# Patient Record
Sex: Male | Born: 1955 | Race: Black or African American | Hispanic: No | Smoking: Current every day smoker
Health system: Southern US, Community
[De-identification: ages and names within clinical notes are randomized; demographics above are authoritative.]

## PROBLEM LIST (undated history)

## (undated) DIAGNOSIS — G8929 Other chronic pain: Secondary | ICD-10-CM

## (undated) DIAGNOSIS — I10 Essential (primary) hypertension: Secondary | ICD-10-CM

## (undated) DIAGNOSIS — M1990 Unspecified osteoarthritis, unspecified site: Secondary | ICD-10-CM

## (undated) DIAGNOSIS — F1910 Other psychoactive substance abuse, uncomplicated: Secondary | ICD-10-CM

## (undated) DIAGNOSIS — Z720 Tobacco use: Secondary | ICD-10-CM

## (undated) DIAGNOSIS — J449 Chronic obstructive pulmonary disease, unspecified: Secondary | ICD-10-CM

## (undated) HISTORY — PX: JOINT REPLACEMENT: SHX530

## (undated) HISTORY — PX: PARTIAL HIP ARTHROPLASTY: SHX733

---

## 2009-08-20 ENCOUNTER — Emergency Department (HOSPITAL_COMMUNITY): Admission: EM | Admit: 2009-08-20 | Discharge: 2009-08-20 | Payer: Self-pay | Admitting: Emergency Medicine

## 2009-09-05 ENCOUNTER — Ambulatory Visit: Payer: Self-pay | Admitting: Nurse Practitioner

## 2009-09-05 ENCOUNTER — Telehealth (INDEPENDENT_AMBULATORY_CARE_PROVIDER_SITE_OTHER): Payer: Self-pay | Admitting: Nurse Practitioner

## 2009-09-05 DIAGNOSIS — F17200 Nicotine dependence, unspecified, uncomplicated: Secondary | ICD-10-CM

## 2009-09-05 DIAGNOSIS — S82409A Unspecified fracture of shaft of unspecified fibula, initial encounter for closed fracture: Secondary | ICD-10-CM | POA: Insufficient documentation

## 2009-09-05 DIAGNOSIS — F341 Dysthymic disorder: Secondary | ICD-10-CM | POA: Insufficient documentation

## 2009-09-05 DIAGNOSIS — S329XXA Fracture of unspecified parts of lumbosacral spine and pelvis, initial encounter for closed fracture: Secondary | ICD-10-CM | POA: Insufficient documentation

## 2009-09-05 DIAGNOSIS — G894 Chronic pain syndrome: Secondary | ICD-10-CM

## 2009-09-05 DIAGNOSIS — I10 Essential (primary) hypertension: Secondary | ICD-10-CM

## 2009-09-05 DIAGNOSIS — Z96649 Presence of unspecified artificial hip joint: Secondary | ICD-10-CM | POA: Insufficient documentation

## 2009-09-05 DIAGNOSIS — Z9889 Other specified postprocedural states: Secondary | ICD-10-CM | POA: Insufficient documentation

## 2009-09-06 ENCOUNTER — Encounter (INDEPENDENT_AMBULATORY_CARE_PROVIDER_SITE_OTHER): Payer: Self-pay | Admitting: Nurse Practitioner

## 2009-09-12 ENCOUNTER — Ambulatory Visit: Payer: Self-pay | Admitting: Nurse Practitioner

## 2009-09-16 ENCOUNTER — Encounter (INDEPENDENT_AMBULATORY_CARE_PROVIDER_SITE_OTHER): Payer: Self-pay | Admitting: Nurse Practitioner

## 2009-09-18 ENCOUNTER — Telehealth (INDEPENDENT_AMBULATORY_CARE_PROVIDER_SITE_OTHER): Payer: Self-pay | Admitting: Nurse Practitioner

## 2009-10-03 ENCOUNTER — Ambulatory Visit: Payer: Self-pay | Admitting: Nurse Practitioner

## 2009-10-03 DIAGNOSIS — K089 Disorder of teeth and supporting structures, unspecified: Secondary | ICD-10-CM | POA: Insufficient documentation

## 2009-10-03 LAB — CONVERTED CEMR LAB: Rapid HIV Screen: NEGATIVE

## 2009-10-05 DIAGNOSIS — D649 Anemia, unspecified: Secondary | ICD-10-CM

## 2009-10-05 LAB — CONVERTED CEMR LAB
ALT: 23 units/L (ref 0–53)
Basophils Absolute: 0 10*3/uL (ref 0.0–0.1)
CO2: 26 meq/L (ref 19–32)
Creatinine, Ser: 1.38 mg/dL (ref 0.40–1.50)
Eosinophils Relative: 3 % (ref 0–5)
HCT: 38.2 % — ABNORMAL LOW (ref 39.0–52.0)
Hemoglobin: 12 g/dL — ABNORMAL LOW (ref 13.0–17.0)
Lymphocytes Relative: 28 % (ref 12–46)
MCHC: 31.4 g/dL (ref 30.0–36.0)
MCV: 91 fL (ref 78.0–100.0)
Monocytes Absolute: 0.4 10*3/uL (ref 0.1–1.0)
PSA: 0.73 ng/mL (ref 0.10–4.00)
RDW: 13.9 % (ref 11.5–15.5)
TSH: 1.233 microintl units/mL (ref 0.350–4.500)
Total Bilirubin: 0.2 mg/dL — ABNORMAL LOW (ref 0.3–1.2)

## 2009-10-14 ENCOUNTER — Encounter (INDEPENDENT_AMBULATORY_CARE_PROVIDER_SITE_OTHER): Payer: Self-pay | Admitting: *Deleted

## 2009-10-16 ENCOUNTER — Encounter (INDEPENDENT_AMBULATORY_CARE_PROVIDER_SITE_OTHER): Payer: Self-pay | Admitting: Nurse Practitioner

## 2009-10-22 ENCOUNTER — Encounter (INDEPENDENT_AMBULATORY_CARE_PROVIDER_SITE_OTHER): Payer: Self-pay | Admitting: Nurse Practitioner

## 2009-10-25 ENCOUNTER — Encounter (INDEPENDENT_AMBULATORY_CARE_PROVIDER_SITE_OTHER): Payer: Self-pay | Admitting: *Deleted

## 2009-10-28 ENCOUNTER — Telehealth (INDEPENDENT_AMBULATORY_CARE_PROVIDER_SITE_OTHER): Payer: Self-pay | Admitting: Nurse Practitioner

## 2009-10-29 ENCOUNTER — Emergency Department (HOSPITAL_COMMUNITY): Admission: EM | Admit: 2009-10-29 | Discharge: 2009-10-29 | Payer: Self-pay | Admitting: Emergency Medicine

## 2009-12-02 ENCOUNTER — Encounter
Admission: RE | Admit: 2009-12-02 | Discharge: 2009-12-02 | Payer: Self-pay | Source: Home / Self Care | Attending: Physical Medicine & Rehabilitation | Admitting: Physical Medicine & Rehabilitation

## 2010-04-08 NOTE — Progress Notes (Signed)
Summary: pain clinic referral   Phone Note Outgoing Call   Summary of Call: F.Y.I. I send a letter to Edgar Burke about his appt WITH THE PAIN CLINIC 10-25-09 because she is not returning my calls. Initial call taken by: Cheryll Dessert,  October 28, 2009 11:53 AM  Follow-up for Phone Call        so did pt miss the appt? ???10/25/2009 or 11/25/2009 Follow-up by: Lehman Prom FNP,  October 28, 2009 12:29 PM  Additional Follow-up for Phone Call Additional follow up Details #1::        Sorry my mistake is 12-06-09 @ 2pm  Additional Follow-up by: Cheryll Dessert,  October 28, 2009 12:52 PM    Additional Follow-up for Phone Call Additional follow up Details #2::    noted Follow-up by: Lehman Prom FNP,  October 28, 2009 1:10 PM

## 2010-04-08 NOTE — Letter (Signed)
Summary: [facility]/APPT DATE & TIME  [facility]   Imported By: Arta Bruce 10/23/2009 10:51:41  _____________________________________________________________________  External Attachment:    Type:   Image     Comment:   External Document

## 2010-04-08 NOTE — Letter (Signed)
Summary: *HSN Results Follow up  [facility]  [address] [place], [place] [address]   Phone: [phone]  Fax: [phone]      10/14/2009   [name] [address] [place], [place]  [address]   Dear  [name],                            ____S.Drinkard,FNP   ____D. Gore,FNP       ____B. McPherson,MD   ____V. Rankins,MD    ____E. Mulberry,MD    ____N. Daphine Deutscher, FNP  ____D. Reche Dixon, MD    ____K. Philipp Deputy, MD    ____Other     This letter is to inform you that your recent test(s):  _______Pap Smear    _______Lab Test     _______X-ray    _______ is within acceptable limits  ____X___ requires a medication change  _______ requires a follow-up lab visit  _______ requires a follow-up visit with your provider   Comments:  We have tried to contact you.  Please call the office at your earliest convenience for lab results.       _________________________________________________________ If you have any questions, please contact our office                     Sincerely,  [name] [facility]
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
## 2010-04-08 NOTE — Letter (Signed)
Summary: *HSN Results Follow up  [facility]  [address] [place], [place] [address]   Phone: [phone]  Fax: [phone]      10/25/2009   SAFWAN TOMEI [address] [place], [place]  [address]   Dear  Mr. Lanard Hern,                            ____S.Drinkard,FNP   ____D. Gore,FNP       ____B. McPherson,MD   ____V. Rankins,MD    ____E. Mulberry,MD    __X__N. Daphine Deutscher, FNP  ____D. Reche Dixon, MD    ____K. Philipp Deputy, MD    ____Other     This letter is to inform you that your recent test(s):  _______Pap Smear    _______Lab Test     _______X-ray    _______ is within acceptable limits  _______ requires a medication change  _______ requires a follow-up lab visit  _______ requires a follow-up visit with your Adyan Palau   Comments: I have been trying to reach you and left 2 messages about your appt  to the pain clinic . Please, call as soon as possible . Thank you .       _________________________________________________________ If you have any questions, please contact our office                     Sincerely,  Cheryll Dessert [facility]

## 2010-04-08 NOTE — Letter (Signed)
Summary: AMANDA'S SUMMARY  AMANDA'S SUMMARY   Imported By: Arta Bruce 11/06/2009 15:51:49  _____________________________________________________________________  External Attachment:    Type:   Image     Comment:   External Document

## 2010-04-08 NOTE — Progress Notes (Signed)
Summary: Referral complain  Phone Note Call from Patient Call back at [phone]   Summary of Call: Pt is complaining because he is waiting too long to be referral for the pain clinic and he also needs more refills from his regular medication.  I explain to him that the referral have been done but we still have not receiving any feedback from the pain clinic. [facility] FNP Initial call taken by: Manon Hilding,  September 18, 2009 9:26 AM  Follow-up for Phone Call        Spoke with Stanton Kidney pt needs to come and sign paper for release of information for the process to go for referrng pt to pain clinic.  Stanton Kidney called pt and is informing him once more of the process today. Follow-up by: [name],  September 18, 2009 3:38 PM    `

## 2010-04-08 NOTE — Progress Notes (Signed)
Summary: Office Visit//DEPRESSION SCREENING  Office Visit//DEPRESSION SCREENING   Imported By: Arta Bruce 11/18/2009 15:24:35  _____________________________________________________________________  External Attachment:    Type:   Image     Comment:   External Document

## 2010-04-08 NOTE — Letter (Signed)
Summary: records from dr.james iserria  records from dr.james iserria   Imported By: Arta Bruce 11/28/2009 15:49:00  _____________________________________________________________________  External Attachment:    Type:   Image     Comment:   External Document

## 2010-04-08 NOTE — Progress Notes (Signed)
Summary: Pain Clinic Referral  Phone Note Outgoing Call   Summary of Call: Refer to pain clinic Pt has mentioned Dr. Laury Axon - unsure what clinic this provider is in but can see him or anyone in that office Chronic narcotic Initial call taken by: Lehman Prom FNP,  September 05, 2009 1:55 PM
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
## 2010-05-23 LAB — RAPID URINE DRUG SCREEN, HOSP PERFORMED
Cocaine: NOT DETECTED
Opiates: POSITIVE — AB
Tetrahydrocannabinol: NOT DETECTED

## 2010-05-26 LAB — RAPID URINE DRUG SCREEN, HOSP PERFORMED
Amphetamines: NOT DETECTED
Barbiturates: NOT DETECTED
Benzodiazepines: POSITIVE — AB
Cocaine: NOT DETECTED
Opiates: NOT DETECTED
Tetrahydrocannabinol: POSITIVE — AB

## 2010-05-26 LAB — CBC
HCT: 38.5 % — ABNORMAL LOW (ref 39.0–52.0)
Hemoglobin: 12.9 g/dL — ABNORMAL LOW (ref 13.0–17.0)
MCHC: 33.4 g/dL (ref 30.0–36.0)
MCV: 90.2 fL (ref 78.0–100.0)
Platelets: 274 K/uL (ref 150–400)
RBC: 4.27 MIL/uL (ref 4.22–5.81)
RDW: 13.7 % (ref 11.5–15.5)
WBC: 8.8 K/uL (ref 4.0–10.5)

## 2010-05-26 LAB — DIFFERENTIAL
Lymphs Abs: 2.5 10*3/uL (ref 0.7–4.0)
Monocytes Relative: 6 % (ref 3–12)
Neutro Abs: 5.7 10*3/uL (ref 1.7–7.7)
Neutrophils Relative %: 64 % (ref 43–77)

## 2010-05-26 LAB — POCT I-STAT, CHEM 8
HCT: 41 % (ref 39.0–52.0)
Hemoglobin: 13.9 g/dL (ref 13.0–17.0)
Potassium: 4 mEq/L (ref 3.5–5.1)
Sodium: 141 mEq/L (ref 135–145)
TCO2: 32 mmol/L (ref 0–100)

## 2012-01-13 ENCOUNTER — Ambulatory Visit (HOSPITAL_COMMUNITY): Payer: Medicare Other | Admitting: Licensed Clinical Social Worker

## 2012-02-10 ENCOUNTER — Encounter (HOSPITAL_COMMUNITY): Payer: Self-pay | Admitting: Licensed Clinical Social Worker

## 2012-02-10 ENCOUNTER — Ambulatory Visit (HOSPITAL_COMMUNITY): Payer: Self-pay | Admitting: Licensed Clinical Social Worker

## 2012-02-10 NOTE — Progress Notes (Signed)
Patient ID: Edgar Burke, male   DOB: Jul 05, 1955, 56 y.o.   MRN: [id number] Patient called and cancelled late for his appointment. This is the second missed appointment for a new patient. Will not schedule again.

## 2012-02-25 ENCOUNTER — Ambulatory Visit (HOSPITAL_COMMUNITY): Payer: Self-pay | Admitting: Licensed Clinical Social Worker

## 2014-04-20 ENCOUNTER — Inpatient Hospital Stay (HOSPITAL_COMMUNITY)
Admission: EM | Admit: 2014-04-20 | Discharge: 2014-04-24 | DRG: 482 | Disposition: A | Discharge status: Skilled Nursing Facility | Payer: Medicare Other | Attending: Orthopedic Surgery | Admitting: Orthopedic Surgery

## 2014-04-20 ENCOUNTER — Emergency Department (HOSPITAL_COMMUNITY): Payer: Medicare Other

## 2014-04-20 ENCOUNTER — Encounter (HOSPITAL_COMMUNITY): Payer: Self-pay | Admitting: Emergency Medicine

## 2014-04-20 DIAGNOSIS — S72322A Displaced transverse fracture of shaft of left femur, initial encounter for closed fracture: Secondary | ICD-10-CM | POA: Diagnosis present

## 2014-04-20 DIAGNOSIS — F17210 Nicotine dependence, cigarettes, uncomplicated: Secondary | ICD-10-CM | POA: Diagnosis present

## 2014-04-20 DIAGNOSIS — S7292XA Unspecified fracture of left femur, initial encounter for closed fracture: Secondary | ICD-10-CM | POA: Diagnosis present

## 2014-04-20 DIAGNOSIS — S7290XA Unspecified fracture of unspecified femur, initial encounter for closed fracture: Secondary | ICD-10-CM | POA: Diagnosis present

## 2014-04-20 DIAGNOSIS — I10 Essential (primary) hypertension: Secondary | ICD-10-CM | POA: Diagnosis present

## 2014-04-20 DIAGNOSIS — T1490XA Injury, unspecified, initial encounter: Secondary | ICD-10-CM

## 2014-04-20 DIAGNOSIS — T148XXA Other injury of unspecified body region, initial encounter: Secondary | ICD-10-CM

## 2014-04-20 DIAGNOSIS — M79652 Pain in left thigh: Secondary | ICD-10-CM | POA: Diagnosis present

## 2014-04-20 DIAGNOSIS — F10129 Alcohol abuse with intoxication, unspecified: Secondary | ICD-10-CM | POA: Diagnosis present

## 2014-04-20 HISTORY — DX: Essential (primary) hypertension: I10

## 2014-04-20 LAB — CBC WITH DIFFERENTIAL/PLATELET
BASOS ABS: 0 10*3/uL (ref 0.0–0.1)
BASOS PCT: 0 % (ref 0–1)
EOS ABS: 0.1 10*3/uL (ref 0.0–0.7)
Eosinophils Relative: 1 % (ref 0–5)
HCT: 40 % (ref 39.0–52.0)
Hemoglobin: 13.1 g/dL (ref 13.0–17.0)
Lymphocytes Relative: 19 % (ref 12–46)
Lymphs Abs: 2.1 10*3/uL (ref 0.7–4.0)
MCH: 29.8 pg (ref 26.0–34.0)
MCHC: 32.8 g/dL (ref 30.0–36.0)
MCV: 91.1 fL (ref 78.0–100.0)
MONO ABS: 0.6 10*3/uL (ref 0.1–1.0)
Monocytes Relative: 5 % (ref 3–12)
Neutro Abs: 8.3 10*3/uL — ABNORMAL HIGH (ref 1.7–7.7)
Neutrophils Relative %: 75 % (ref 43–77)
Platelets: 280 10*3/uL (ref 150–400)
RBC: 4.39 MIL/uL (ref 4.22–5.81)
RDW: 14 % (ref 11.5–15.5)
WBC: 11.1 10*3/uL — AB (ref 4.0–10.5)

## 2014-04-20 LAB — BASIC METABOLIC PANEL
Anion gap: 10 (ref 5–15)
BUN: 20 mg/dL (ref 6–23)
CO2: 27 mmol/L (ref 19–32)
Calcium: 9 mg/dL (ref 8.4–10.5)
Chloride: 101 mmol/L (ref 96–112)
Creatinine, Ser: 1.09 mg/dL (ref 0.50–1.35)
GFR calc Af Amer: 85 mL/min — ABNORMAL LOW (ref >90)
GFR, EST NON AFRICAN AMERICAN: 73 mL/min — AB (ref >90)
GLUCOSE: 85 mg/dL (ref 70–99)
POTASSIUM: 3.9 mmol/L (ref 3.5–5.1)
SODIUM: 138 mmol/L (ref 135–145)

## 2014-04-20 LAB — ETHANOL: Alcohol, Ethyl (B): 63 mg/dL — ABNORMAL HIGH (ref 0–9)

## 2014-04-20 MED ORDER — HYDROMORPHONE HCL 1 MG/ML IJ SOLN
1.0000 mg | Freq: Once | INTRAMUSCULAR | Status: AC
  Administered 2014-04-20: 1 mg via INTRAVENOUS
  Filled 2014-04-20: qty 1

## 2014-04-20 NOTE — ED Notes (Signed)
Pt. Refused for Ibuprofen for pain, requested of something ' Stronger. "  MD aware.
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
## 2014-04-20 NOTE — ED Notes (Signed)
Nurse starting IV 

## 2014-04-20 NOTE — ED Notes (Signed)
[facility] Expected date:  Expected time:  Means of arrival:  Comments: EMS 58yo M, fall, thigh pain, ETOH

## 2014-04-20 NOTE — ED Notes (Signed)
MD at bedside. 
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
## 2014-04-21 ENCOUNTER — Inpatient Hospital Stay (HOSPITAL_COMMUNITY): Payer: Medicare Other | Admitting: Certified Registered Nurse Anesthetist

## 2014-04-21 ENCOUNTER — Encounter (HOSPITAL_COMMUNITY)
Admission: EM | Disposition: A | Discharge status: Skilled Nursing Facility | Payer: Self-pay | Source: Home / Self Care | Attending: Orthopedic Surgery

## 2014-04-21 ENCOUNTER — Inpatient Hospital Stay (HOSPITAL_COMMUNITY): Payer: Medicare Other

## 2014-04-21 ENCOUNTER — Encounter (HOSPITAL_COMMUNITY): Payer: Self-pay | Admitting: Certified Registered"

## 2014-04-21 DIAGNOSIS — S7290XA Unspecified fracture of unspecified femur, initial encounter for closed fracture: Secondary | ICD-10-CM | POA: Diagnosis present

## 2014-04-21 HISTORY — PX: FEMUR IM NAIL: SHX1597

## 2014-04-21 LAB — RAPID URINE DRUG SCREEN, HOSP PERFORMED
AMPHETAMINES: NOT DETECTED
Barbiturates: NOT DETECTED
Benzodiazepines: POSITIVE — AB
COCAINE: NOT DETECTED
Opiates: NOT DETECTED
TETRAHYDROCANNABINOL: POSITIVE — AB

## 2014-04-21 SURGERY — INSERTION, INTRAMEDULLARY ROD, FEMUR, RETROGRADE
Anesthesia: General | Site: Leg Upper | Laterality: Left

## 2014-04-21 MED ORDER — HYDRALAZINE HCL 20 MG/ML IJ SOLN
INTRAMUSCULAR | Status: DC | PRN
  Administered 2014-04-21: 10 mg via INTRAVENOUS

## 2014-04-21 MED ORDER — CEFAZOLIN SODIUM-DEXTROSE 2-3 GM-% IV SOLR
2.0000 g | INTRAVENOUS | Status: AC
  Administered 2014-04-21: 2 g via INTRAVENOUS
  Filled 2014-04-21: qty 50

## 2014-04-21 MED ORDER — SODIUM CHLORIDE 0.9 % IV SOLN
INTRAVENOUS | Status: AC
  Administered 2014-04-21: 04:00:00 via INTRAVENOUS

## 2014-04-21 MED ORDER — ASPIRIN EC 325 MG PO TBEC
325.0000 mg | DELAYED_RELEASE_TABLET | Freq: Every day | ORAL | Status: DC
  Administered 2014-04-22 – 2014-04-24 (×3): 325 mg via ORAL
  Filled 2014-04-21 (×5): qty 1

## 2014-04-21 MED ORDER — METOCLOPRAMIDE HCL 10 MG PO TABS: 5.0000 mg | ORAL_TABLET | Freq: Three times a day (TID) | ORAL | Status: DC | PRN

## 2014-04-21 MED ORDER — METHADONE HCL 10 MG PO TABS
10.0000 mg | ORAL_TABLET | Freq: Three times a day (TID) | ORAL | Status: DC
  Administered 2014-04-21 – 2014-04-24 (×11): 10 mg via ORAL
  Filled 2014-04-21 (×11): qty 1

## 2014-04-21 MED ORDER — ACETAMINOPHEN 325 MG PO TABS: 650.0000 mg | ORAL_TABLET | Freq: Four times a day (QID) | ORAL | Status: DC | PRN

## 2014-04-21 MED ORDER — ALBUMIN HUMAN 5 % IV SOLN
INTRAVENOUS | Status: AC
  Administered 2014-04-21: 12.5 g via INTRAVENOUS
  Filled 2014-04-21: qty 250

## 2014-04-21 MED ORDER — LIDOCAINE HCL (CARDIAC) 20 MG/ML IV SOLN
INTRAVENOUS | Status: DC | PRN
  Administered 2014-04-21: 100 mg via INTRAVENOUS

## 2014-04-21 MED ORDER — HYDROMORPHONE HCL 1 MG/ML IJ SOLN
1.0000 mg | INTRAMUSCULAR | Status: AC | PRN
  Administered 2014-04-21 (×3): 1 mg via INTRAVENOUS
  Filled 2014-04-21 (×4): qty 1

## 2014-04-21 MED ORDER — METOCLOPRAMIDE HCL 5 MG/ML IJ SOLN: 5.0000 mg | Freq: Three times a day (TID) | INTRAMUSCULAR | Status: DC | PRN

## 2014-04-21 MED ORDER — OXYCODONE-ACETAMINOPHEN 5-325 MG PO TABS
2.0000 | ORAL_TABLET | ORAL | Status: DC | PRN
Start: 2014-04-21 — End: 2014-04-22
  Administered 2014-04-21: 2 via ORAL
  Filled 2014-04-21: qty 2

## 2014-04-21 MED ORDER — GLYCOPYRROLATE 0.2 MG/ML IJ SOLN
INTRAMUSCULAR | Status: AC
  Administered 2014-04-21: 0.4 mg via INTRAVENOUS
  Filled 2014-04-21: qty 2

## 2014-04-21 MED ORDER — HYDRALAZINE HCL 20 MG/ML IJ SOLN
INTRAMUSCULAR | Status: AC
  Filled 2014-04-21: qty 1

## 2014-04-21 MED ORDER — 0.9 % SODIUM CHLORIDE (POUR BTL) OPTIME
TOPICAL | Status: DC | PRN
  Administered 2014-04-21: 1000 mL

## 2014-04-21 MED ORDER — OXYCODONE HCL 5 MG PO TABS
5.0000 mg | ORAL_TABLET | ORAL | Status: DC | PRN
  Administered 2014-04-22: 10 mg via ORAL
  Filled 2014-04-21: qty 2

## 2014-04-21 MED ORDER — ALBUMIN HUMAN 5 % IV SOLN
12.5000 g | Freq: Once | INTRAVENOUS | Status: AC
  Administered 2014-04-21: 12.5 g via INTRAVENOUS

## 2014-04-21 MED ORDER — METOPROLOL TARTRATE 1 MG/ML IV SOLN
INTRAVENOUS | Status: AC
  Filled 2014-04-21: qty 5

## 2014-04-21 MED ORDER — ACETAMINOPHEN 650 MG RE SUPP: 650.0000 mg | Freq: Four times a day (QID) | RECTAL | Status: DC | PRN

## 2014-04-21 MED ORDER — DOCUSATE SODIUM 100 MG PO CAPS
100.0000 mg | ORAL_CAPSULE | Freq: Two times a day (BID) | ORAL | Status: DC
  Administered 2014-04-21 – 2014-04-24 (×7): 100 mg via ORAL
  Filled 2014-04-21 (×11): qty 1

## 2014-04-21 MED ORDER — ONDANSETRON HCL 4 MG/2ML IJ SOLN
INTRAMUSCULAR | Status: AC
  Filled 2014-04-21: qty 2

## 2014-04-21 MED ORDER — PROPOFOL 10 MG/ML IV BOLUS
INTRAVENOUS | Status: DC | PRN
  Administered 2014-04-21: 200 mg via INTRAVENOUS

## 2014-04-21 MED ORDER — HYDRALAZINE HCL 25 MG PO TABS
25.0000 mg | ORAL_TABLET | Freq: Two times a day (BID) | ORAL | Status: DC
  Administered 2014-04-21 – 2014-04-24 (×7): 25 mg via ORAL
  Filled 2014-04-21 (×10): qty 1

## 2014-04-21 MED ORDER — METOPROLOL TARTRATE 1 MG/ML IV SOLN
INTRAVENOUS | Status: DC | PRN
  Administered 2014-04-21 (×2): 2.5 mg via INTRAVENOUS

## 2014-04-21 MED ORDER — POLYETHYLENE GLYCOL 3350 17 G PO PACK
17.0000 g | PACK | Freq: Every day | ORAL | Status: DC | PRN
  Administered 2014-04-23 – 2014-04-24 (×2): 17 g via ORAL
  Filled 2014-04-21 (×2): qty 1

## 2014-04-21 MED ORDER — MENTHOL 3 MG MT LOZG: 1.0000 | LOZENGE | OROMUCOSAL | Status: DC | PRN

## 2014-04-21 MED ORDER — OXYCODONE HCL 5 MG PO TABS: 5.0000 mg | ORAL_TABLET | Freq: Once | ORAL | Status: AC | PRN

## 2014-04-21 MED ORDER — SUCCINYLCHOLINE CHLORIDE 20 MG/ML IJ SOLN
INTRAMUSCULAR | Status: AC
  Filled 2014-04-21: qty 1

## 2014-04-21 MED ORDER — ROCURONIUM BROMIDE 100 MG/10ML IV SOLN
INTRAVENOUS | Status: DC | PRN
Start: 2014-04-21 — End: 2014-04-21
  Administered 2014-04-21: 10 mg via INTRAVENOUS
  Administered 2014-04-21: 40 mg via INTRAVENOUS

## 2014-04-21 MED ORDER — PROMETHAZINE HCL 25 MG/ML IJ SOLN: 6.2500 mg | INTRAMUSCULAR | Status: DC | PRN

## 2014-04-21 MED ORDER — LACTATED RINGERS IV SOLN
INTRAVENOUS | Status: DC | PRN
  Administered 2014-04-21: 07:00:00 via INTRAVENOUS

## 2014-04-21 MED ORDER — ACETAMINOPHEN 500 MG PO TABS
1000.0000 mg | ORAL_TABLET | Freq: Once | ORAL | Status: AC
  Administered 2014-04-21: 1000 mg via ORAL
  Filled 2014-04-21: qty 2

## 2014-04-21 MED ORDER — ONDANSETRON HCL 4 MG/2ML IJ SOLN: 4.0000 mg | Freq: Three times a day (TID) | INTRAMUSCULAR | Status: AC | PRN

## 2014-04-21 MED ORDER — MIDAZOLAM HCL 5 MG/5ML IJ SOLN
INTRAMUSCULAR | Status: DC | PRN
  Administered 2014-04-21: 2 mg via INTRAVENOUS

## 2014-04-21 MED ORDER — NEBIVOLOL HCL 5 MG PO TABS
5.0000 mg | ORAL_TABLET | Freq: Every day | ORAL | Status: DC
  Administered 2014-04-21 – 2014-04-24 (×4): 5 mg via ORAL
  Filled 2014-04-21 (×4): qty 1

## 2014-04-21 MED ORDER — PROPOFOL 10 MG/ML IV BOLUS
INTRAVENOUS | Status: AC
  Filled 2014-04-21: qty 20

## 2014-04-21 MED ORDER — PHENOL 1.4 % MT LIQD: 1.0000 | OROMUCOSAL | Status: DC | PRN

## 2014-04-21 MED ORDER — DEXTROSE-NACL 5-0.45 % IV SOLN
INTRAVENOUS | Status: AC
  Administered 2014-04-21: 12:00:00 via INTRAVENOUS

## 2014-04-21 MED ORDER — LIDOCAINE HCL (CARDIAC) 20 MG/ML IV SOLN
INTRAVENOUS | Status: AC
Start: 2014-04-21 — End: 2014-04-21
  Filled 2014-04-21: qty 5

## 2014-04-21 MED ORDER — DEXTROSE-NACL 5-0.45 % IV SOLN: 100.0000 mL/h | INTRAVENOUS | Status: DC

## 2014-04-21 MED ORDER — HYDROMORPHONE HCL 1 MG/ML IJ SOLN: 0.2500 mg | INTRAMUSCULAR | Status: DC | PRN

## 2014-04-21 MED ORDER — FENTANYL CITRATE 0.05 MG/ML IJ SOLN
INTRAMUSCULAR | Status: AC
  Filled 2014-04-21: qty 5

## 2014-04-21 MED ORDER — OXYCODONE HCL 5 MG/5ML PO SOLN: 5.0000 mg | Freq: Once | ORAL | Status: AC | PRN

## 2014-04-21 MED ORDER — ONDANSETRON HCL 4 MG/2ML IJ SOLN
INTRAMUSCULAR | Status: DC | PRN
  Administered 2014-04-21: 4 mg via INTRAVENOUS

## 2014-04-21 MED ORDER — FENTANYL CITRATE 0.05 MG/ML IJ SOLN
INTRAMUSCULAR | Status: DC | PRN
  Administered 2014-04-21: 100 ug via INTRAVENOUS
  Administered 2014-04-21: 150 ug via INTRAVENOUS

## 2014-04-21 MED ORDER — CEFAZOLIN SODIUM-DEXTROSE 2-3 GM-% IV SOLR
2.0000 g | Freq: Four times a day (QID) | INTRAVENOUS | Status: AC
  Administered 2014-04-21 (×2): 2 g via INTRAVENOUS
  Filled 2014-04-21 (×3): qty 50

## 2014-04-21 MED ORDER — ROCURONIUM BROMIDE 50 MG/5ML IV SOLN
INTRAVENOUS | Status: AC
  Filled 2014-04-21: qty 1

## 2014-04-21 MED ORDER — HYDROMORPHONE HCL 1 MG/ML IJ SOLN
0.1000 mg | INTRAMUSCULAR | Status: DC | PRN
Start: 2014-04-21 — End: 2014-04-22

## 2014-04-21 MED ORDER — GLYCOPYRROLATE 0.2 MG/ML IJ SOLN
0.4000 mg | Freq: Once | INTRAMUSCULAR | Status: AC
  Administered 2014-04-21: 0.4 mg via INTRAVENOUS

## 2014-04-21 MED ORDER — ALPRAZOLAM 0.5 MG PO TABS
1.0000 mg | ORAL_TABLET | Freq: Four times a day (QID) | ORAL | Status: DC
  Administered 2014-04-21 – 2014-04-24 (×14): 1 mg via ORAL
  Filled 2014-04-21 (×15): qty 2

## 2014-04-21 MED ORDER — MIDAZOLAM HCL 2 MG/2ML IJ SOLN
INTRAMUSCULAR | Status: AC
  Filled 2014-04-21: qty 2

## 2014-04-21 SURGICAL SUPPLY — 54 items
BANDAGE ELASTIC 4 VELCRO ST LF (GAUZE/BANDAGES/DRESSINGS) IMPLANT
BANDAGE ELASTIC 6 VELCRO ST LF (GAUZE/BANDAGES/DRESSINGS) IMPLANT
BANDAGE ESMARK 6X9 LF (GAUZE/BANDAGES/DRESSINGS) IMPLANT
BIT DRILL AO GAMMA 4.2X180 (BIT) ×3 IMPLANT
BLADE SURG ROTATE 9660 (MISCELLANEOUS) IMPLANT
BNDG COHESIVE 6X5 TAN STRL LF (GAUZE/BANDAGES/DRESSINGS) ×3 IMPLANT
BNDG ESMARK 6X9 LF (GAUZE/BANDAGES/DRESSINGS)
COVER MAYO STAND STRL (DRAPES) ×3 IMPLANT
COVER SURGICAL LIGHT HANDLE (MISCELLANEOUS) ×6 IMPLANT
CUFF TOURNIQUET SINGLE 34IN LL (TOURNIQUET CUFF) IMPLANT
DRAPE C-ARM 42X72 X-RAY (DRAPES) ×3 IMPLANT
DRAPE IMP U-DRAPE 54X76 (DRAPES) IMPLANT
DRAPE ORTHO SPLIT 77X108 STRL (DRAPES)
DRAPE PROXIMA HALF (DRAPES) IMPLANT
DRAPE SURG ORHT 6 SPLT 77X108 (DRAPES) IMPLANT
DRAPE U-SHAPE 47X51 STRL (DRAPES) ×3 IMPLANT
DRSG MEPILEX BORDER 4X4 (GAUZE/BANDAGES/DRESSINGS) ×9 IMPLANT
DRSG MEPILEX BORDER 4X8 (GAUZE/BANDAGES/DRESSINGS) ×3 IMPLANT
DURAPREP 26ML APPLICATOR (WOUND CARE) ×3 IMPLANT
ELECT REM PT RETURN 9FT ADLT (ELECTROSURGICAL) ×3
ELECTRODE REM PT RTRN 9FT ADLT (ELECTROSURGICAL) ×1 IMPLANT
GLOVE BIO SURGEON STRL SZ7.5 (GLOVE) ×6 IMPLANT
GLOVE BIOGEL PI IND STRL 8 (GLOVE) ×1 IMPLANT
GLOVE BIOGEL PI INDICATOR 8 (GLOVE) ×2
GOWN STRL REUS W/ TWL LRG LVL3 (GOWN DISPOSABLE) ×1 IMPLANT
GOWN STRL REUS W/ TWL XL LVL3 (GOWN DISPOSABLE) ×1 IMPLANT
GOWN STRL REUS W/TWL LRG LVL3 (GOWN DISPOSABLE) ×2
GOWN STRL REUS W/TWL XL LVL3 (GOWN DISPOSABLE) ×2
GUIDEROD T2 3X1000 (ROD) ×3 IMPLANT
K-WIRE  3.2X450M STR (WIRE) ×2
K-WIRE 3.2X450M STR (WIRE) ×1
KIT BASIN OR (CUSTOM PROCEDURE TRAY) ×3 IMPLANT
KIT ROOM TURNOVER OR (KITS) ×3 IMPLANT
KWIRE 3.2X450M STR (WIRE) ×1 IMPLANT
MANIFOLD NEPTUNE II (INSTRUMENTS) IMPLANT
NAIL GAMMA 10X420X125 (Nail) ×3 IMPLANT
NEEDLE 22X1 1/2 (OR ONLY) (NEEDLE) IMPLANT
NS IRRIG 1000ML POUR BTL (IV SOLUTION) ×3 IMPLANT
PACK GENERAL/GYN (CUSTOM PROCEDURE TRAY) ×3 IMPLANT
PACK UNIVERSAL I (CUSTOM PROCEDURE TRAY) ×3 IMPLANT
PAD ARMBOARD 7.5X6 YLW CONV (MISCELLANEOUS) ×6 IMPLANT
REAMER SHAFT BIXCUT (INSTRUMENTS) ×3 IMPLANT
SCREW LAG GAMMA 3 TI 10.5X85MM (Screw) ×3 IMPLANT
SCREW LOCKING THREADED 5X47.5 (Screw) ×3 IMPLANT
STOCKINETTE IMPERVIOUS LG (DRAPES) IMPLANT
SUT MNCRL AB 4-0 PS2 18 (SUTURE) IMPLANT
SUT MON AB 2-0 CT1 27 (SUTURE) IMPLANT
SUT VIC AB 0 CT1 27 (SUTURE)
SUT VIC AB 0 CT1 27XBRD ANBCTR (SUTURE) IMPLANT
SYR CONTROL 10ML LL (SYRINGE) IMPLANT
TOWEL OR 17X24 6PK STRL BLUE (TOWEL DISPOSABLE) ×3 IMPLANT
TOWEL OR 17X26 10 PK STRL BLUE (TOWEL DISPOSABLE) ×3 IMPLANT
TOWEL OR NON WOVEN STRL DISP B (DISPOSABLE) IMPLANT
WATER STERILE IRR 1000ML POUR (IV SOLUTION) ×3 IMPLANT

## 2014-04-21 NOTE — Op Note (Signed)
DATE OF SURGERY:  04/21/2014  TIME: 2:54 PM  PATIENT NAME:  Edgar Burke  AGE: 59 y.o.  PRE-OPERATIVE DIAGNOSIS:  left femur fracture  POST-OPERATIVE DIAGNOSIS:  SAME  PROCEDURE:  INTRAMEDULLARY (IM) RETROGRADE FEMORAL NAILING  SURGEON:  Melaney Tellefsen, D  ASSISTANT:  none  OPERATIVE IMPLANTS: Stryker Gamma Nail with distal interlock screw  PREOPERATIVE INDICATIONS:  Edgar Burke is a 59 y.o. year old who fell and suffered a hip fracture. He was brought into the ER and then admitted and optimized and then elected for surgical intervention.    The risks benefits and alternatives were discussed with the patient including but not limited to the risks of nonoperative treatment, versus surgical intervention including infection, bleeding, nerve injury, malunion, nonunion, hardware prominence, hardware failure, need for hardware removal, blood clots, cardiopulmonary complications, morbidity, mortality, among others, and they were willing to proceed.  We discussed the fact that he is already planning on having a total hip arthroplasty on the left side. We do do need to get the femur to heal so I did recommend operative fixation of this which would then have to be removed for a total hip but I do not feel that there is any way to place an implant that would not need to be removed for a total hip felt that a gamma nail rather than a Recon nail left fewer stress risers when removed for a total hip arthroplasty.    OPERATIVE PROCEDURE:  The patient was brought to the operating room and placed in the supine position. General anesthesia was administered, with a foley. He was placed on the fracture table.  Closed reduction was performed under C-arm guidance. The length of the femur was also measured using fluoroscopy. Time out was then performed after sterile prep and drape. He received preoperative antibiotics.  I took multiple live fluoroscopic images to confirm that there was no acute femoral  neck fracture and that his femoral head collapse was chronic and stable in nature.  Incision was made proximal to the greater trochanter. A guidewire was placed in the appropriate position. Confirmation was made on AP and lateral views. The above-named nail was opened. I opened the proximal femur with a reamer. I then placed the nail by hand easily down. I did not need to ream the femur.  Once the nail was completely seated, I placed a guidepin into the femoral head into the center center position. I measured the length, and then reamed the lateral cortex and up into the head. I then placed the lag screw. Slight compression was applied. Anatomic fixation achieved. Bone quality was mediocre.  I then secured the proximal interlocking bolt, and took off a half a turn, and then removed the instruments, and took final C-arm pictures AP and lateral the entire length of the leg.  I then remove traction and compressed across the transverse fracture. I confirm that his toes were point straight up.  I then used perfect circles technique to place a distal interlock screw.   Anatomic reconstruction was achieved, and the wounds were irrigated copiously and closed with Vicryl followed by staples and sterile gauze for the skin. The patient was awakened and returned to PACU in stable and satisfactory condition. There no complications and the patient tolerated the procedure well.  He will be weightbearing as tolerated, and will be on ASA 325  for a period of four weeks after discharge.   Margarita Rana, M.D.    This note was generated using a template  and dragon dictation system. In light of that, I have reviewed the note and all aspects of it are applicable to this case. Any dictation errors are due to the computerized dictation system.

## 2014-04-21 NOTE — ED Notes (Signed)
[facility] preparing to leave; call to [facility]/Sharon, RN

## 2014-04-21 NOTE — Transfer of Care (Signed)
Immediate Anesthesia Transfer of Care Note  Patient: Edgar Burke  Procedure(s) Performed: Procedure(s): INTRAMEDULLARY (IM) RETROGRADE FEMORAL NAILING (Left)  Patient Location: PACU  Anesthesia Type:General  Level of Consciousness: awake, alert  and oriented  Airway & Oxygen Therapy: Patient Spontanous Breathing and Patient connected to nasal cannula oxygen  Post-op Assessment: Report given to RN, Post -op Vital signs reviewed and stable and Patient moving all extremities  Post vital signs: Reviewed and stable  Last Vitals:  Filed Vitals:   04/21/14 0615  BP: 168/88  Pulse: 72  Temp: 36.6 C  Resp: 18    Complications: No apparent anesthesia complications

## 2014-04-21 NOTE — ED Notes (Signed)
[facility]; will call when [facility] arrives to this facility

## 2014-04-21 NOTE — Anesthesia Postprocedure Evaluation (Signed)
  Anesthesia Post-op Note  Patient: Edgar Burke  Procedure(s) Performed: Procedure(s): INTRAMEDULLARY (IM) RETROGRADE FEMORAL NAILING (Left)  Patient Location: PACU  Anesthesia Type:General  Level of Consciousness: awake and alert   Airway and Oxygen Therapy: Patient Spontanous Breathing  Post-op Pain: none  Post-op Assessment: Post-op Vital signs reviewed  Post-op Vital Signs: Reviewed  Last Vitals:  Filed Vitals:   04/21/14 1029  BP:   Pulse:   Temp: 36.8 C  Resp:     Complications: No apparent anesthesia complications

## 2014-04-21 NOTE — Progress Notes (Signed)
Patient returned to room 5N30 at 1045 from surgical procedure. Alert and in stable condition.

## 2014-04-21 NOTE — Anesthesia Procedure Notes (Signed)
Procedure Name: Intubation Date/Time: 04/21/2014 7:40 AM Performed by: Charm BargesBUTLER, Celena Lanius R Pre-anesthesia Checklist: Patient identified, Emergency Drugs available, Suction available, Patient being monitored and Timeout performed Patient Re-evaluated:Patient Re-evaluated prior to inductionOxygen Delivery Method: Circle system utilized Preoxygenation: Pre-oxygenation with 100% oxygen Intubation Type: IV induction Ventilation: Mask ventilation without difficulty Laryngoscope Size: Mac and 4 Grade View: Grade I Tube type: Oral Tube size: 8.0 mm Number of attempts: 1 Airway Equipment and Method: Stylet Placement Confirmation: ETT inserted through vocal cords under direct vision,  positive ETCO2 and breath sounds checked- equal and bilateral Secured at: 22 cm Tube secured with: Tape Dental Injury: Teeth and Oropharynx as per pre-operative assessment

## 2014-04-21 NOTE — Progress Notes (Signed)
Orthopedic Tech Progress Note Patient Details:  Edgar Burke [birth year] [id number] Spoke with patient, patient stated he did not need OHF at this time Patient ID: Edgar Burke, male   DOB: [birth year], 59 y.o.   MRN: [id number]   [name] 04/21/2014, 1:17 PM

## 2014-04-21 NOTE — Anesthesia Preprocedure Evaluation (Addendum)
Anesthesia Evaluation  Patient identified by MRN, date of birth, ID band Patient awake    Reviewed: Allergy & Precautions, NPO status , Patient's Chart, lab work & pertinent test results, reviewed documented beta blocker date and time   Airway Mallampati: I  TM Distance: >3 FB Neck ROM: Full    Dental  (+) Edentulous Upper, Upper Dentures, Chipped, Missing, Dental Advisory Given   Pulmonary Current Smoker,  breath sounds clear to auscultation        Cardiovascular hypertension, Pt. on medications and Pt. on home beta blockers Rhythm:Regular Rate:Normal     Neuro/Psych Depression negative neurological ROS     GI/Hepatic negative GI ROS, Neg liver ROS,   Endo/Other  negative endocrine ROS  Renal/GU negative Renal ROS     Musculoskeletal   Abdominal   Peds  Hematology negative hematology ROS (+)   Anesthesia Other Findings   Reproductive/Obstetrics                           Anesthesia Physical Anesthesia Plan  ASA: II  Anesthesia Plan: General   Post-op Pain Management:    Induction: Intravenous  Airway Management Planned: Oral ETT  Additional Equipment:   Intra-op Plan:   Post-operative Plan: Extubation in OR  Informed Consent: I have reviewed the patients History and Physical, chart, labs and discussed the procedure including the risks, benefits and alternatives for the proposed anesthesia with the patient or authorized representative who has indicated his/her understanding and acceptance.   Dental advisory given  Plan Discussed with: CRNA, Anesthesiologist and Surgeon  Anesthesia Plan Comments:        Anesthesia Quick Evaluation
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
## 2014-04-22 MED ORDER — SODIUM CHLORIDE 0.9 % IJ SOLN: 3.0000 mL | INTRAMUSCULAR | Status: DC | PRN

## 2014-04-22 MED ORDER — HYDROMORPHONE HCL 1 MG/ML IJ SOLN: 1.0000 mg | INTRAMUSCULAR | Status: DC | PRN

## 2014-04-22 MED ORDER — HYDROMORPHONE HCL 1 MG/ML IJ SOLN
1.0000 mg | INTRAMUSCULAR | Status: DC | PRN
  Administered 2014-04-22 – 2014-04-24 (×10): 1 mg via INTRAVENOUS
  Filled 2014-04-22 (×10): qty 1

## 2014-04-22 MED ORDER — SODIUM CHLORIDE 0.9 % IJ SOLN
3.0000 mL | Freq: Two times a day (BID) | INTRAMUSCULAR | Status: DC
  Administered 2014-04-22 – 2014-04-24 (×5): 3 mL via INTRAVENOUS

## 2014-04-22 MED ORDER — HYDROMORPHONE HCL 1 MG/ML IJ SOLN
1.0000 mg | INTRAMUSCULAR | Status: AC | PRN
  Administered 2014-04-22: 1 mg via INTRAVENOUS

## 2014-04-22 MED ORDER — OXYCODONE-ACETAMINOPHEN 5-325 MG PO TABS
1.0000 | ORAL_TABLET | ORAL | Status: DC | PRN
  Administered 2014-04-22 – 2014-04-24 (×10): 2 via ORAL
  Filled 2014-04-22 (×10): qty 2

## 2014-04-22 NOTE — Evaluation (Signed)
Physical Therapy Evaluation Patient Details Name: Edgar Burke MRN: [id number] DOB: 10-04-1955 Today's Date: 04/22/2014   History of Present Illness  Pt is a 59 y.o. male who fell at home and suffered Lt hip fx. Pt s/p IM nail Lt hip on 04/21/14.  Clinical Impression  Pt adm due to above. Pt very anxious and limited by pain. Pt presents with significant decrease in functional mobility secondary to deficits indicated below (see PT problem list). Pt to benefit from skilled acute PT to address deficits and maximize functional mobility. Pt lives alone and will require SNF for post acute rehab and 24/7 (A) upon D/C.     Follow Up Recommendations SNF;Supervision/Assistance - 24 hour    Equipment Recommendations  Rolling walker with 5" wheels;3in1 (PT)    Recommendations for Other Services OT consult     Precautions / Restrictions Precautions Precautions: Fall Restrictions Weight Bearing Restrictions: Yes LLE Weight Bearing: Weight bearing as tolerated      Mobility  Bed Mobility Overal bed mobility: Needs Assistance Bed Mobility: Supine to Sit     Supine to sit: Mod assist;HOB elevated     General bed mobility comments: (A) to bring Lt LE off EOB; incr time and max cueing due to anxiety and pain; pt relying heavily on handrails   Transfers Overall transfer level: Needs assistance Equipment used: Rolling walker (2 wheeled) Transfers: Sit to/from [employer] Transfers Sit to Stand: +2 safety/equipment;Min assist;From elevated surface Stand pivot transfers: +2 safety/equipment;Min assist;From elevated surface       General transfer comment: pt very anxious and becomes agitated due to pain; requires incr time; cues for deep breathing and max cues for safety; 2 person (A) for safety; primarily for safety due to pt anxiety and pain; limited to SPT only due to pain; keeping Lt LE off floor due to pain with WB'ing   Ambulation/Gait             General Gait Details:  pivotal steps only due to pain  Stairs            Wheelchair Mobility    Modified Rankin (Stroke Patients Only)       Balance Overall balance assessment: Needs assistance Sitting-balance support: Feet supported;Single extremity supported;Bilateral upper extremity supported Sitting balance-Leahy Scale: Poor Sitting balance - Comments: pt leaning posteriorly very anxious and guarded; denied any dizziness; sat EOB ~6 min  Postural control: Posterior lean Standing balance support: During functional activity;Bilateral upper extremity supported Standing balance-Leahy Scale: Zero Standing balance comment: 2 person (A) and RW                              Pertinent Vitals/Pain Pain Assessment: 0-10 Pain Score: 9  Pain Location: Lt hip Pain Descriptors / Indicators: Grimacing;Constant;Throbbing Pain Intervention(s): Limited activity within patient's tolerance;Monitored during session;Premedicated before session;Repositioned    Home Living Family/patient expects to be discharged to:: Skilled nursing facility                 Additional Comments: Pt lives alone    Prior Function Level of Independence: Independent         Comments: pt reports he normally does not ambulate with AD but has been for ~7 days due to cold and pain in bil knees     Hand Dominance        Extremity/Trunk Assessment   Upper Extremity Assessment: Defer to OT evaluation  Lower Extremity Assessment: LLE deficits/detail   LLE Deficits / Details: pt guarded; unable to assess full strength and ROM due to anxiety and pain   Cervical / Trunk Assessment: Normal  Communication   Communication: No difficulties  Cognition Arousal/Alertness: Awake/alert Behavior During Therapy: Anxious Overall Cognitive Status: Within Functional Limits for tasks assessed                      General Comments General comments (skin integrity, edema, etc.): encouraged OOB  activity with nursing     Exercises Low Level/ICU Exercises Ankle Circles/Pumps: AROM;Both;10 reps;Seated      Assessment/Plan    PT Assessment Patient needs continued PT services  PT Diagnosis Difficulty walking;Generalized weakness;Acute pain   PT Problem List Decreased strength;Decreased range of motion;Decreased activity tolerance;Decreased balance;Decreased mobility;Decreased knowledge of precautions;Decreased safety awareness;Decreased knowledge of use of DME;Pain  PT Treatment Interventions DME instruction;Gait training;Functional mobility training;Therapeutic activities;Therapeutic exercise;Balance training;Neuromuscular re-education;Patient/family education   PT Goals (Current goals can be found in the Care Plan section) Acute Rehab PT Goals Patient Stated Goal: to get help before going home PT Goal Formulation: With patient Time For Goal Achievement: 04/29/14 Potential to Achieve Goals: Good    Frequency Min 3X/week   Barriers to discharge Decreased caregiver support lives alone    Co-evaluation               End of Session Equipment Utilized During Treatment: Gait belt Activity Tolerance: Patient limited by pain Patient left: in chair;with call bell/phone within reach Nurse Communication: Mobility status;Precautions;Weight bearing status         Time: [id number] PT Time Calculation (min) (ACUTE ONLY): 24 min   Charges:   PT Evaluation $Initial PT Evaluation Tier I: 1 Procedure PT Treatments $Therapeutic Activity: 8-22 mins   PT G CodesDonell Sievert:        Huxley Shurley N, [place]PT  [phone] 04/22/2014, 2:12 PM

## 2014-04-22 NOTE — Progress Notes (Signed)
OT Cancellation Note  Patient Details Name: Edgar Burke MRN: [id number] DOB: Jul 28, 1955   Cancelled Treatment:    Reason Eval/Treat Not Completed: OT screened. Pt's current D/C plan is SNF. No apparent immediate acute care OT needs, therefore will defer OT to SNF. If OT eval is needed please call Acute Rehab Dept. at [phone] or text page OT at [phone].    Earlie RavelingStraub, Ludwika Rodd L OTR/L [phone]  04/22/2014, 2:53 PM

## 2014-04-22 NOTE — Progress Notes (Signed)
Utilization Review Completed.Edgar Burke T2/14/2016  

## 2014-04-22 NOTE — Progress Notes (Signed)
    Subjective:  Patient reports his pain has not been well controlled last night into today.  He was on methadone, percocet and xanax for pain at home and does not feel we are giving him that level of pain control in the hospital.    Objective:   VITALS:   Filed Vitals:   04/21/14 1140 04/21/14 1635 04/21/14 2028 04/21/14 2209  BP:  149/63 127/80 151/69  Pulse:  73 72   Temp:  97.9 F (36.6 C) 98.8 F (37.1 C)   TempSrc:  Oral Oral   Resp: 18 18 18    SpO2: 98% 94% 93%     Physical Exam Patient is resting in bed  Dressing: C/D/I  Compartments soft  SILT DP/SP/S/S/T, 2+DP, +TA/GS/EHL  LABS  No results found for this or any previous visit (from the past 24 hour(s)).   Assessment/Plan: Active Problems:   Femur fracture, left   Femur fracture   PLAN: Weight Bearing:  Dressings: C/D/I VTE prophylaxis:   I have spoke with the nurse and she will is going to try to help monitor pain control today for [name].     Edgar Burke, Edgar Burke, D 04/22/2014, 8:25 AM   Edgar Burke Edgar Papania, MD Cell [phone]

## 2014-04-23 ENCOUNTER — Encounter (HOSPITAL_COMMUNITY): Payer: Self-pay | Admitting: Orthopedic Surgery

## 2014-04-23 NOTE — Progress Notes (Signed)
     Subjective:  Patient reports pain as moderate.  He will hopefully work with PT/OT today.  Objective:   VITALS:   Filed Vitals:   04/23/14 0658 04/23/14 0800 04/23/14 1159 04/23/14 1600  BP: 137/67     Pulse: 74     Temp: 98.1 F (36.7 C)     TempSrc: Oral     Resp: 16 16 16 16   SpO2: 97% 97% 97% 97%    Neurologically intact ABD soft Neurovascular intact Sensation intact distally Intact pulses distally Incision: moderate drainage   Lab Results  Component Value Date   WBC 11.1* 04/20/2014   HGB 13.1 04/20/2014   HCT 40.0 04/20/2014   MCV 91.1 04/20/2014   PLT 280 04/20/2014   BMET    Component Value Date/Time   NA 138 04/20/2014 2213   K 3.9 04/20/2014 2213   CL 101 04/20/2014 2213   CO2 27 04/20/2014 2213   GLUCOSE 85 04/20/2014 2213   BUN 20 04/20/2014 2213   CREATININE 1.09 04/20/2014 2213   CALCIUM 9.0 04/20/2014 2213   GFRNONAA 73* 04/20/2014 2213   GFRAA 85* 04/20/2014 2213     Assessment/Plan: 2 Days Post-Op   Active Problems:   Femur fracture, left   Femur fracture   Up with therapy Dressing: ok to change PRN Plan to D/C to SNF    Edgar Burke Marie 04/23/2014, 4:39 PM   Janalee DaneBrittney Vern Prestia PA-C [phone]

## 2014-04-24 MED ORDER — HYDROMORPHONE HCL 2 MG PO TABS: 2.0000 mg | ORAL_TABLET | ORAL | Status: DC | PRN

## 2014-04-24 MED ORDER — HYDROMORPHONE HCL 2 MG PO TABS
2.0000 mg | ORAL_TABLET | ORAL | Status: DC | PRN
  Administered 2014-04-24: 2 mg via ORAL
  Filled 2014-04-24: qty 1

## 2014-04-24 MED ORDER — DOCUSATE SODIUM 100 MG PO CAPS: 100.0000 mg | ORAL_CAPSULE | Freq: Two times a day (BID) | ORAL | Status: DC

## 2014-04-24 MED ORDER — ONDANSETRON HCL 4 MG PO TABS: 4.0000 mg | ORAL_TABLET | Freq: Three times a day (TID) | ORAL | Status: DC | PRN

## 2014-04-24 MED ORDER — ASPIRIN EC 325 MG PO TBEC: 325.0000 mg | DELAYED_RELEASE_TABLET | Freq: Every day | ORAL | Status: DC

## 2014-04-24 NOTE — Progress Notes (Signed)
Physical Therapy Treatment Patient Details Name: Edgar Burke MRN: [id number] DOB: February 24, 1956 Today's Date: 04/24/2014    History of Present Illness Pt is a 59 y.o. male who fell at home and suffered Lt hip fx. Pt s/p IM nail Lt hip on 04/21/14.    PT Comments    Pt required max encouragement to participate in therapy. Agreeable to transfer to [facility] and ambulate short distance in room only. Pt very slow and requires incr time and max cues for all mobility due to pain and anxiety. Cont to recommend SNF for post acute rehab.   Follow Up Recommendations  SNF;Supervision/Assistance - 24 hour     Equipment Recommendations  Rolling walker with 5" wheels;3in1 (PT)    Recommendations for Other Services       Precautions / Restrictions Precautions Precautions: Fall Restrictions Weight Bearing Restrictions: Yes LLE Weight Bearing: Weight bearing as tolerated    Mobility  Bed Mobility Overal bed mobility: Needs Assistance Bed Mobility: Supine to Sit     Supine to sit: Min assist;HOB elevated     General bed mobility comments: incr time and max cues due to pain ; (A) to bring Lt LE to; off EOB  Transfers Overall transfer level: Needs assistance Equipment used: Rolling walker (2 wheeled) Transfers: Sit to/from [employer] Transfers Sit to Stand: +2 safety/equipment;Min assist;From elevated surface Stand pivot transfers: +2 safety/equipment;Min assist;From elevated surface       General transfer comment: incr time due to anxiety and pain; max cues for sequencing; pt with difficulty sitting due to pain ; (A) to manage Lt LE with sitting; performed SPT to [facility] initially  Ambulation/Gait Ambulation/Gait assistance: +2 safety/equipment;Min assist Ambulation Distance (Feet): 12 Feet Assistive device: Rolling walker (2 wheeled) Gait Pattern/deviations: Step-to pattern;Decreased stance time - right;Decreased step length - right;Antalgic;Trunk flexed Gait velocity: decr  Gait  velocity interpretation: Below normal speed for age/gender General Gait Details: pt unable to get Lt foot flat on ground due to pain; cues for safety with RW; 2nd person following with chair for safety   Stairs            Wheelchair Mobility    Modified Rankin (Stroke Patients Only)       Balance Overall balance assessment: Needs assistance Sitting-balance support: Feet supported;No upper extremity supported Sitting balance-Leahy Scale: Poor Sitting balance - Comments: heavy lean posteriorly and requiring UE support at times Postural control: Posterior lean Standing balance support: During functional activity;Bilateral upper extremity supported Standing balance-Leahy Scale: Poor Standing balance comment: RW to balance ; pt very anxious                    Cognition Arousal/Alertness: Awake/alert Behavior During Therapy: Anxious Overall Cognitive Status: Within Functional Limits for tasks assessed                      Exercises      General Comments General comments (skin integrity, edema, etc.): educated on importance of mobility and OOB       Pertinent Vitals/Pain Pain Assessment: 0-10 Pain Score: 8  Pain Location: Lt hip with movement Pain Descriptors / Indicators: Constant;Sharp Pain Intervention(s): Monitored during session;Premedicated before session;Limited activity within patient's tolerance;Repositioned    Home Living                      Prior Function            PT Goals (current goals can now be found in  the care plan section) Acute Rehab PT Goals Patient Stated Goal: to get more pain medicine PT Goal Formulation: With patient Time For Goal Achievement: 04/29/14 Potential to Achieve Goals: Good Progress towards PT goals: Progressing toward goals    Frequency  Min 3X/week    PT Plan Current plan remains appropriate    Co-evaluation             End of Session Equipment Utilized During Treatment: Gait  belt Activity Tolerance: Patient limited by pain Patient left: in chair;with call bell/phone within reach     Time: 0852-0916 PT Time Calculation (min) (ACUTE ONLY): 24 min  Charges:  $Gait Training: 8-22 mins $Therapeutic Activity: 8-22 mins                    G CodesDonell Burke:      Edgar Burke, [place]PT  [phone] 04/24/2014, 10:35 AM

## 2014-04-24 NOTE — Clinical Social Work Psychosocial (Signed)
Clinical Social Work Department BRIEF PSYCHOSOCIAL ASSESSMENT 04/24/2014  Patient:  Edgar Burke, Edgar Burke     Account Number:  [id number]     Admit date:  04/20/2014  Clinical Social Worker:  Delrae Sawyers  Date/Time:  04/24/2014 10:52 AM  Referred by:  Physician  Date Referred:  04/24/2014 Referred for  SNF Placement   Other Referral:   none.   Interview type:  Patient Other interview type:   none.    PSYCHOSOCIAL DATA Living Status:  ALONE Admitted from facility:   Level of care:   Primary support name:  Edgar Burke Primary support relationship to patient:  SPOUSE Degree of support available:   Adequate support system. Patient reports patient's wife lives in [place], [place].    CURRENT CONCERNS Current Concerns  Post-Acute Placement   Other Concerns:   none.    SOCIAL WORK ASSESSMENT / PLAN CSW received referral for possible SNF placement at time of discharge. CSW met with patient at bedside to discuss discharge disposition with patient. Patient stated understanding and agreeable to SNF placement at time of discharge. Patient reports patient support is patient's wife, Edgar Burke, who lives in [place], [place]. Patient expressed relief regarding discharge plan for SNF as patient lives alone and verbalized concern of returning home alone.    CSW to continue to follow and assist with discharge planning needs.   Assessment/plan status:  Psychosocial Support/Ongoing Assessment of Needs Other assessment/ plan:   none.   Information/referral to community resources:   [facility] bed offers.    PATIENT'S/FAMILY'S RESPONSE TO PLAN OF CARE: Patient understanding and agreeable to CSW plan of care. Patient expressed no further questions or concerns at this time.       Edgar Burke, [place] [phone]) Licensed Clinical Social Worker Orthopedics [phone]) and Surgical [phone])

## 2014-04-24 NOTE — Progress Notes (Signed)
Report called to [facility].
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
## 2014-04-24 NOTE — Clinical Social Work Placement (Addendum)
Clinical Social Work Department CLINICAL SOCIAL WORK PLACEMENT NOTE 04/24/2014  Patient:  Edgar Burke,Edgar Burke  Account Number:  [id number] Admit date:  04/20/2014  Clinical Social Worker:  Mosie EpsteinEMILY S Avalee Castrellon, LCSWA  Date/time:  04/24/2014 10:56 AM  Clinical Social Work is seeking post-discharge placement for this patient at the following level of care:   SKILLED NURSING   (*CSW will update this form in Epic as items are completed)   04/24/2014  Patient/family provided with [facility] of Clinical Social Work's list of facilities offering this level of care within the geographic area requested by the patient (or if unable, by the patient's family).  04/24/2014  Patient/family informed of their freedom to choose among providers that offer the needed level of care, that participate in Medicare, Medicaid or managed care program needed by the patient, have an available bed and are willing to accept the patient.  04/24/2014  Patient/family informed of [facility]' ownership interest in [facility], as well as of the fact that they are under no obligation to receive care at this facility.  PASARR submitted to EDS on 04/24/2014 PASARR number received on 04/24/2014  FL2 transmitted to all facilities in geographic area requested by pt/family on  04/24/2014 FL2 transmitted to all facilities within larger geographic area on   Patient informed that his/her managed care company has contracts with or will negotiate with  certain facilities, including the following:     Patient/family informed of bed offers received:  04/24/2014 Patient chooses bed at [facility] Physician recommends and patient chooses bed at    Patient to be transferred to  [facility] on  04/24/2014 Patient to be transferred to facility by [facility] Patient and family notified of transfer on 04/24/2014 Name of family member notified:  Patient updated at bedside.  The following physician  request were entered in Epic:   Additional Comments:  Lily Kochermily Roxine Whittinghill, LCSWA [phone]) Licensed Clinical Social Worker Orthopedics [phone]([id number]) and Surgical [phone]([id number])

## 2014-04-24 NOTE — Discharge Instructions (Signed)
Bear weight as tolerated ° °Keep incisions dry and covered °

## 2014-04-24 NOTE — Progress Notes (Signed)
Patient upset about his pain regimen. Binnie KandBrittany Marie Kelly, [facility] explained to patient yesterday that the goal was to use Percocet for pain management and Dilaudid for breakthrough pain.  He would still get his scheduled Methadone and Xanax. Patient was given PO Percocet (2) and Dilaudid 1mg  IV this AM at approximately 08:30. He then received his scheduled at 10:50. He is complaining that he is not getting enough pain medication. I explained the regimen to him again and his response to me was: "Don't talk to me like that. I am a patient here and I pay your salary". He verbalized the same to [facility], NT earlier this morning when she tried to [facility]. I explained to patient that I was administering his pain medications according to physician's orders. He said he would speak to Dr. Eulah PontMurphy today to "get this straightened out". Patient may be discharged to SNF today if bed offer comes in.

## 2014-04-24 NOTE — Clinical Social Work Note (Signed)
Patient to be discharged to [facility]. Patient updated at bedside.  Facility: [facility] Report number: [phone] Transportation[facility] [address]  Marcelline Deistmily Meril Dray, LCSWA [phone]) Licensed Clinical Social Worker Orthopedics [phone]([id number]) and Surgical [phone]([id number])

## 2015-02-03 ENCOUNTER — Encounter (HOSPITAL_COMMUNITY): Payer: Self-pay | Admitting: Emergency Medicine

## 2015-02-03 ENCOUNTER — Emergency Department (HOSPITAL_COMMUNITY)
Admission: EM | Admit: 2015-02-03 | Discharge: 2015-02-04 | Disposition: A | Discharge status: Home / Self Care | Payer: Medicare Other | Attending: Emergency Medicine | Admitting: Emergency Medicine

## 2015-02-03 DIAGNOSIS — Z791 Long term (current) use of non-steroidal anti-inflammatories (NSAID): Secondary | ICD-10-CM | POA: Insufficient documentation

## 2015-02-03 DIAGNOSIS — I10 Essential (primary) hypertension: Secondary | ICD-10-CM | POA: Diagnosis not present

## 2015-02-03 DIAGNOSIS — M79602 Pain in left arm: Secondary | ICD-10-CM

## 2015-02-03 DIAGNOSIS — R42 Dizziness and giddiness: Secondary | ICD-10-CM | POA: Diagnosis present

## 2015-02-03 DIAGNOSIS — Z9889 Other specified postprocedural states: Secondary | ICD-10-CM | POA: Diagnosis not present

## 2015-02-03 DIAGNOSIS — F17210 Nicotine dependence, cigarettes, uncomplicated: Secondary | ICD-10-CM | POA: Diagnosis not present

## 2015-02-03 DIAGNOSIS — G8929 Other chronic pain: Secondary | ICD-10-CM | POA: Diagnosis not present

## 2015-02-03 DIAGNOSIS — Z79899 Other long term (current) drug therapy: Secondary | ICD-10-CM | POA: Insufficient documentation

## 2015-02-03 DIAGNOSIS — IMO0001 Reserved for inherently not codable concepts without codable children: Secondary | ICD-10-CM

## 2015-02-03 DIAGNOSIS — S79911A Unspecified injury of right hip, initial encounter: Secondary | ICD-10-CM | POA: Insufficient documentation

## 2015-02-03 DIAGNOSIS — M79605 Pain in left leg: Secondary | ICD-10-CM

## 2015-02-03 DIAGNOSIS — S8992XA Unspecified injury of left lower leg, initial encounter: Secondary | ICD-10-CM | POA: Insufficient documentation

## 2015-02-03 DIAGNOSIS — Y92410 Unspecified street and highway as the place of occurrence of the external cause: Secondary | ICD-10-CM | POA: Diagnosis not present

## 2015-02-03 DIAGNOSIS — M1711 Unilateral primary osteoarthritis, right knee: Secondary | ICD-10-CM | POA: Insufficient documentation

## 2015-02-03 DIAGNOSIS — Y998 Other external cause status: Secondary | ICD-10-CM | POA: Insufficient documentation

## 2015-02-03 DIAGNOSIS — Y9389 Activity, other specified: Secondary | ICD-10-CM | POA: Insufficient documentation

## 2015-02-03 DIAGNOSIS — Z7982 Long term (current) use of aspirin: Secondary | ICD-10-CM | POA: Insufficient documentation

## 2015-02-03 DIAGNOSIS — S79912A Unspecified injury of left hip, initial encounter: Secondary | ICD-10-CM | POA: Insufficient documentation

## 2015-02-03 DIAGNOSIS — R030 Elevated blood-pressure reading, without diagnosis of hypertension: Secondary | ICD-10-CM

## 2015-02-03 HISTORY — DX: Unspecified osteoarthritis, unspecified site: M19.90
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
## 2015-02-04 ENCOUNTER — Emergency Department (HOSPITAL_COMMUNITY): Payer: Medicare Other

## 2015-02-04 LAB — I-STAT CHEM 8, ED
BUN: 30 mg/dL — AB (ref 6–20)
Calcium, Ion: 1.1 mmol/L — ABNORMAL LOW (ref 1.12–1.23)
Chloride: 102 mmol/L (ref 101–111)
Creatinine, Ser: 1 mg/dL (ref 0.61–1.24)
Glucose, Bld: 83 mg/dL (ref 65–99)
HEMATOCRIT: 40 % (ref 39.0–52.0)
Hemoglobin: 13.6 g/dL (ref 13.0–17.0)
Potassium: 4.2 mmol/L (ref 3.5–5.1)
Sodium: 139 mmol/L (ref 135–145)
TCO2: 26 mmol/L (ref 0–100)

## 2015-02-04 MED ORDER — IBUPROFEN 600 MG PO TABS: 600.0000 mg | ORAL_TABLET | Freq: Four times a day (QID) | ORAL | Status: DC | PRN

## 2015-02-04 MED ORDER — KETOROLAC TROMETHAMINE 60 MG/2ML IM SOLN
60.0000 mg | Freq: Once | INTRAMUSCULAR | Status: AC
  Administered 2015-02-04: 60 mg via INTRAMUSCULAR
  Filled 2015-02-04: qty 2
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
## 2015-02-04 NOTE — ED Notes (Signed)
Pt requesting ETOH test

## 2015-02-04 NOTE — ED Notes (Signed)
Pt refused to sign.  

## 2015-02-04 NOTE — ED Notes (Signed)
Pt requesting to stay here for a few days to "get him together"
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
## 2018-09-15 ENCOUNTER — Encounter (HOSPITAL_COMMUNITY): Payer: Self-pay

## 2018-09-15 ENCOUNTER — Other Ambulatory Visit: Payer: Self-pay

## 2018-09-15 ENCOUNTER — Emergency Department (HOSPITAL_COMMUNITY): Payer: Medicare Other

## 2018-09-15 ENCOUNTER — Inpatient Hospital Stay (HOSPITAL_COMMUNITY)
Admission: EM | Admit: 2018-09-15 | Discharge: 2018-09-17 | DRG: 190 | Disposition: A | Discharge status: Home / Self Care | Payer: Medicare Other | Attending: Internal Medicine | Admitting: Internal Medicine

## 2018-09-15 DIAGNOSIS — Z79891 Long term (current) use of opiate analgesic: Secondary | ICD-10-CM

## 2018-09-15 DIAGNOSIS — N182 Chronic kidney disease, stage 2 (mild): Secondary | ICD-10-CM | POA: Diagnosis present

## 2018-09-15 DIAGNOSIS — J9601 Acute respiratory failure with hypoxia: Secondary | ICD-10-CM | POA: Diagnosis present

## 2018-09-15 DIAGNOSIS — Z7982 Long term (current) use of aspirin: Secondary | ICD-10-CM

## 2018-09-15 DIAGNOSIS — Z789 Other specified health status: Secondary | ICD-10-CM

## 2018-09-15 DIAGNOSIS — J441 Chronic obstructive pulmonary disease with (acute) exacerbation: Secondary | ICD-10-CM | POA: Diagnosis not present

## 2018-09-15 DIAGNOSIS — Z8249 Family history of ischemic heart disease and other diseases of the circulatory system: Secondary | ICD-10-CM

## 2018-09-15 DIAGNOSIS — F1010 Alcohol abuse, uncomplicated: Secondary | ICD-10-CM | POA: Diagnosis present

## 2018-09-15 DIAGNOSIS — Y92410 Unspecified street and highway as the place of occurrence of the external cause: Secondary | ICD-10-CM

## 2018-09-15 DIAGNOSIS — R413 Other amnesia: Secondary | ICD-10-CM | POA: Diagnosis present

## 2018-09-15 DIAGNOSIS — F17210 Nicotine dependence, cigarettes, uncomplicated: Secondary | ICD-10-CM | POA: Diagnosis present

## 2018-09-15 DIAGNOSIS — F418 Other specified anxiety disorders: Secondary | ICD-10-CM | POA: Diagnosis present

## 2018-09-15 DIAGNOSIS — Z7289 Other problems related to lifestyle: Secondary | ICD-10-CM

## 2018-09-15 DIAGNOSIS — R0902 Hypoxemia: Secondary | ICD-10-CM | POA: Diagnosis not present

## 2018-09-15 DIAGNOSIS — Z1159 Encounter for screening for other viral diseases: Secondary | ICD-10-CM

## 2018-09-15 DIAGNOSIS — Z79899 Other long term (current) drug therapy: Secondary | ICD-10-CM

## 2018-09-15 DIAGNOSIS — G8929 Other chronic pain: Secondary | ICD-10-CM | POA: Diagnosis present

## 2018-09-15 DIAGNOSIS — Z888 Allergy status to other drugs, medicaments and biological substances status: Secondary | ICD-10-CM

## 2018-09-15 DIAGNOSIS — F1090 Alcohol use, unspecified, uncomplicated: Secondary | ICD-10-CM

## 2018-09-15 DIAGNOSIS — E861 Hypovolemia: Secondary | ICD-10-CM | POA: Diagnosis present

## 2018-09-15 DIAGNOSIS — I129 Hypertensive chronic kidney disease with stage 1 through stage 4 chronic kidney disease, or unspecified chronic kidney disease: Secondary | ICD-10-CM | POA: Diagnosis present

## 2018-09-15 DIAGNOSIS — N179 Acute kidney failure, unspecified: Secondary | ICD-10-CM | POA: Diagnosis present

## 2018-09-15 DIAGNOSIS — F341 Dysthymic disorder: Secondary | ICD-10-CM | POA: Diagnosis present

## 2018-09-15 DIAGNOSIS — G894 Chronic pain syndrome: Secondary | ICD-10-CM | POA: Diagnosis present

## 2018-09-15 DIAGNOSIS — I10 Essential (primary) hypertension: Secondary | ICD-10-CM | POA: Diagnosis present

## 2018-09-15 HISTORY — DX: Chronic obstructive pulmonary disease, unspecified: J44.9

## 2018-09-15 HISTORY — DX: Other psychoactive substance abuse, uncomplicated: F19.10

## 2018-09-15 HISTORY — DX: Tobacco use: Z72.0

## 2018-09-15 HISTORY — DX: Other chronic pain: G89.29

## 2018-09-15 LAB — CBC WITH DIFFERENTIAL/PLATELET
Abs Immature Granulocytes: 0.02 10*3/uL (ref 0.00–0.07)
Basophils Absolute: 0 10*3/uL (ref 0.0–0.1)
Basophils Relative: 0 %
Eosinophils Absolute: 0.1 10*3/uL (ref 0.0–0.5)
Eosinophils Relative: 2 %
HCT: 43.3 % (ref 39.0–52.0)
Hemoglobin: 14 g/dL (ref 13.0–17.0)
Immature Granulocytes: 0 %
Lymphocytes Relative: 23 %
Lymphs Abs: 1.7 10*3/uL (ref 0.7–4.0)
MCH: 31.3 pg (ref 26.0–34.0)
MCHC: 32.3 g/dL (ref 30.0–36.0)
MCV: 96.9 fL (ref 80.0–100.0)
Monocytes Absolute: 0.4 10*3/uL (ref 0.1–1.0)
Monocytes Relative: 6 %
Neutro Abs: 5.1 10*3/uL (ref 1.7–7.7)
Neutrophils Relative %: 69 %
Platelets: 128 10*3/uL — ABNORMAL LOW (ref 150–400)
RBC: 4.47 MIL/uL (ref 4.22–5.81)
RDW: 15.4 % (ref 11.5–15.5)
WBC: 7.4 10*3/uL (ref 4.0–10.5)
nRBC: 0 % (ref 0.0–0.2)

## 2018-09-15 LAB — BASIC METABOLIC PANEL
Anion gap: 14 (ref 5–15)
BUN: 35 mg/dL — ABNORMAL HIGH (ref 8–23)
CO2: 23 mmol/L (ref 22–32)
Calcium: 8.2 mg/dL — ABNORMAL LOW (ref 8.9–10.3)
Chloride: 103 mmol/L (ref 98–111)
Creatinine, Ser: 1.74 mg/dL — ABNORMAL HIGH (ref 0.61–1.24)
GFR calc Af Amer: 48 mL/min — ABNORMAL LOW (ref >60)
GFR calc non Af Amer: 41 mL/min — ABNORMAL LOW (ref >60)
Glucose, Bld: 69 mg/dL — ABNORMAL LOW (ref 70–99)
Potassium: 5.3 mmol/L — ABNORMAL HIGH (ref 3.5–5.1)
Sodium: 140 mmol/L (ref 135–145)

## 2018-09-15 LAB — CBG MONITORING, ED
Glucose-Capillary: 69 mg/dL — ABNORMAL LOW (ref 70–99)
Glucose-Capillary: 71 mg/dL (ref 70–99)
Glucose-Capillary: 87 mg/dL (ref 70–99)
Glucose-Capillary: 74 mg/dL (ref 70–99)

## 2018-09-15 LAB — ETHANOL: Alcohol, Ethyl (B): 45 mg/dL — ABNORMAL HIGH (ref <10)

## 2018-09-15 LAB — BRAIN NATRIURETIC PEPTIDE: B Natriuretic Peptide: 904.9 pg/mL — ABNORMAL HIGH (ref 0.0–100.0)

## 2018-09-15 LAB — POTASSIUM: Potassium: 4.4 mmol/L (ref 3.5–5.1)

## 2018-09-15 LAB — SARS CORONAVIRUS 2 BY RT PCR (HOSPITAL ORDER, PERFORMED IN ~~LOC~~ HOSPITAL LAB): SARS Coronavirus 2: NEGATIVE

## 2018-09-15 MED ORDER — SODIUM CHLORIDE 0.9 % IV BOLUS
1000.0000 mL | Freq: Once | INTRAVENOUS | Status: AC
  Administered 2018-09-15: 1000 mL via INTRAVENOUS

## 2018-09-15 MED ORDER — METHYLPREDNISOLONE SODIUM SUCC 125 MG IJ SOLR
125.0000 mg | Freq: Once | INTRAMUSCULAR | Status: AC
  Administered 2018-09-15: 125 mg via INTRAVENOUS
  Filled 2018-09-15: qty 2

## 2018-09-15 MED ORDER — ALBUTEROL SULFATE HFA 108 (90 BASE) MCG/ACT IN AERS
8.0000 | INHALATION_SPRAY | Freq: Once | RESPIRATORY_TRACT | Status: AC
  Administered 2018-09-15: 8 via RESPIRATORY_TRACT
  Filled 2018-09-15: qty 6.7

## 2018-09-15 MED ORDER — NICOTINE 21 MG/24HR TD PT24
21.0000 mg | MEDICATED_PATCH | Freq: Once | TRANSDERMAL | Status: AC
  Administered 2018-09-15: 21 mg via TRANSDERMAL
  Filled 2018-09-15: qty 1

## 2018-09-15 MED ORDER — IOHEXOL 300 MG/ML  SOLN
100.0000 mL | Freq: Once | INTRAMUSCULAR | Status: AC | PRN
  Administered 2018-09-15: 100 mL via INTRAVENOUS

## 2018-09-15 MED ORDER — HYDRALAZINE HCL 20 MG/ML IJ SOLN
10.0000 mg | Freq: Four times a day (QID) | INTRAMUSCULAR | Status: DC | PRN
  Administered 2018-09-15 – 2018-09-17 (×4): 10 mg via INTRAVENOUS
  Filled 2018-09-15 (×5): qty 1

## 2018-09-15 NOTE — Progress Notes (Signed)
Received patient from ED, AOx4, transferred to bed with 1 assist, elevated BP at 198/106, O2Sat at 94% on 2L/min via [place], oriented to room, bed controls and call light.  Paged Dr. Posey Pronto for further orders and called back to put new orders and will see the patient on the unit.  CNA gave patient ice water and OJ to drink.  Will monitor.
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
## 2018-09-15 NOTE — ED Notes (Signed)
Pt encouraged to drink juice and asked if he needed food he does not want to eat.  He remains awake

## 2018-09-15 NOTE — ED Notes (Signed)
The pt reports that the doctor told him he could go smoke and come back  Was told that this is a non-smoking campus and if he leave to smoke he leaves altogether

## 2018-09-15 NOTE — ED Notes (Signed)
To ct

## 2018-09-15 NOTE — ED Notes (Signed)
Pt returned from ct

## 2018-09-15 NOTE — ED Notes (Signed)
The pts speech is slow he moves all his extremities  He knows the day of the week   The month  He thinks its 2012  After stating that bush was the president  He then said trump

## 2018-09-15 NOTE — ED Notes (Signed)
PA [place] aware of CBG 69. Will continue to monitor.

## 2018-09-16 ENCOUNTER — Encounter (HOSPITAL_COMMUNITY): Payer: Self-pay | Admitting: Internal Medicine

## 2018-09-16 DIAGNOSIS — Z79891 Long term (current) use of opiate analgesic: Secondary | ICD-10-CM | POA: Diagnosis not present

## 2018-09-16 DIAGNOSIS — I129 Hypertensive chronic kidney disease with stage 1 through stage 4 chronic kidney disease, or unspecified chronic kidney disease: Secondary | ICD-10-CM | POA: Diagnosis present

## 2018-09-16 DIAGNOSIS — N179 Acute kidney failure, unspecified: Secondary | ICD-10-CM | POA: Diagnosis present

## 2018-09-16 DIAGNOSIS — Z8249 Family history of ischemic heart disease and other diseases of the circulatory system: Secondary | ICD-10-CM | POA: Diagnosis not present

## 2018-09-16 DIAGNOSIS — G894 Chronic pain syndrome: Secondary | ICD-10-CM | POA: Diagnosis present

## 2018-09-16 DIAGNOSIS — J9601 Acute respiratory failure with hypoxia: Secondary | ICD-10-CM | POA: Diagnosis present

## 2018-09-16 DIAGNOSIS — Z888 Allergy status to other drugs, medicaments and biological substances status: Secondary | ICD-10-CM | POA: Diagnosis not present

## 2018-09-16 DIAGNOSIS — J441 Chronic obstructive pulmonary disease with (acute) exacerbation: Secondary | ICD-10-CM | POA: Diagnosis present

## 2018-09-16 DIAGNOSIS — F1010 Alcohol abuse, uncomplicated: Secondary | ICD-10-CM | POA: Diagnosis present

## 2018-09-16 DIAGNOSIS — Z1159 Encounter for screening for other viral diseases: Secondary | ICD-10-CM | POA: Diagnosis not present

## 2018-09-16 DIAGNOSIS — Z79899 Other long term (current) drug therapy: Secondary | ICD-10-CM | POA: Diagnosis not present

## 2018-09-16 DIAGNOSIS — R413 Other amnesia: Secondary | ICD-10-CM | POA: Diagnosis present

## 2018-09-16 DIAGNOSIS — F418 Other specified anxiety disorders: Secondary | ICD-10-CM | POA: Diagnosis present

## 2018-09-16 DIAGNOSIS — Y92410 Unspecified street and highway as the place of occurrence of the external cause: Secondary | ICD-10-CM | POA: Diagnosis not present

## 2018-09-16 DIAGNOSIS — N182 Chronic kidney disease, stage 2 (mild): Secondary | ICD-10-CM | POA: Diagnosis present

## 2018-09-16 DIAGNOSIS — F17210 Nicotine dependence, cigarettes, uncomplicated: Secondary | ICD-10-CM | POA: Diagnosis present

## 2018-09-16 DIAGNOSIS — Z7982 Long term (current) use of aspirin: Secondary | ICD-10-CM | POA: Diagnosis not present

## 2018-09-16 DIAGNOSIS — R0902 Hypoxemia: Secondary | ICD-10-CM | POA: Diagnosis present

## 2018-09-16 DIAGNOSIS — E861 Hypovolemia: Secondary | ICD-10-CM | POA: Diagnosis present

## 2018-09-16 LAB — CBC
HCT: 47.4 % (ref 39.0–52.0)
Hemoglobin: 15.4 g/dL (ref 13.0–17.0)
MCH: 30.9 pg (ref 26.0–34.0)
MCHC: 32.5 g/dL (ref 30.0–36.0)
MCV: 95.2 fL (ref 80.0–100.0)
Platelets: 142 10*3/uL — ABNORMAL LOW (ref 150–400)
RBC: 4.98 MIL/uL (ref 4.22–5.81)
RDW: 15.3 % (ref 11.5–15.5)
WBC: 7.9 10*3/uL (ref 4.0–10.5)
nRBC: 0 % (ref 0.0–0.2)

## 2018-09-16 LAB — BASIC METABOLIC PANEL
Anion gap: 11 (ref 5–15)
BUN: 28 mg/dL — ABNORMAL HIGH (ref 8–23)
CO2: 23 mmol/L (ref 22–32)
Calcium: 8.2 mg/dL — ABNORMAL LOW (ref 8.9–10.3)
Chloride: 104 mmol/L (ref 98–111)
Creatinine, Ser: 1.28 mg/dL — ABNORMAL HIGH (ref 0.61–1.24)
GFR calc Af Amer: >60 mL/min (ref >60)
GFR calc non Af Amer: 60 mL/min — ABNORMAL LOW (ref >60)
Glucose, Bld: 99 mg/dL (ref 70–99)
Potassium: 4.6 mmol/L (ref 3.5–5.1)
Sodium: 138 mmol/L (ref 135–145)

## 2018-09-16 LAB — HIV ANTIBODY (ROUTINE TESTING W REFLEX): HIV Screen 4th Generation wRfx: NONREACTIVE

## 2018-09-16 MED ORDER — ONDANSETRON HCL 4 MG PO TABS: 4.0000 mg | ORAL_TABLET | Freq: Four times a day (QID) | ORAL | Status: DC | PRN

## 2018-09-16 MED ORDER — ALBUTEROL SULFATE (2.5 MG/3ML) 0.083% IN NEBU: 2.5000 mg | INHALATION_SOLUTION | RESPIRATORY_TRACT | Status: DC | PRN

## 2018-09-16 MED ORDER — VITAMIN B-1 100 MG PO TABS
100.0000 mg | ORAL_TABLET | Freq: Every day | ORAL | Status: DC
  Filled 2018-09-16: qty 1

## 2018-09-16 MED ORDER — ADULT MULTIVITAMIN W/MINERALS CH
1.0000 | ORAL_TABLET | Freq: Every day | ORAL | Status: DC
  Administered 2018-09-17: 10:00:00 1 via ORAL
  Filled 2018-09-16 (×2): qty 1

## 2018-09-16 MED ORDER — CLONIDINE HCL 0.1 MG PO TABS
0.1000 mg | ORAL_TABLET | Freq: Two times a day (BID) | ORAL | Status: DC
  Administered 2018-09-16 – 2018-09-17 (×3): 0.1 mg via ORAL
  Filled 2018-09-16 (×3): qty 1

## 2018-09-16 MED ORDER — LORAZEPAM 2 MG/ML IJ SOLN: 1.0000 mg | Freq: Four times a day (QID) | INTRAMUSCULAR | Status: DC | PRN

## 2018-09-16 MED ORDER — OXYCODONE HCL 5 MG PO TABS
5.0000 mg | ORAL_TABLET | Freq: Three times a day (TID) | ORAL | Status: DC | PRN
  Administered 2018-09-16 – 2018-09-17 (×3): 5 mg via ORAL
  Filled 2018-09-16 (×3): qty 1

## 2018-09-16 MED ORDER — OXYCODONE-ACETAMINOPHEN 5-325 MG PO TABS
1.0000 | ORAL_TABLET | Freq: Three times a day (TID) | ORAL | Status: DC | PRN
  Administered 2018-09-16 – 2018-09-17 (×3): 1 via ORAL
  Filled 2018-09-16 (×3): qty 1

## 2018-09-16 MED ORDER — ALPRAZOLAM 0.5 MG PO TABS
1.0000 mg | ORAL_TABLET | Freq: Three times a day (TID) | ORAL | Status: DC | PRN
  Administered 2018-09-16 (×2): 1 mg via ORAL
  Filled 2018-09-16 (×2): qty 2

## 2018-09-16 MED ORDER — METHADONE HCL 10 MG PO TABS
10.0000 mg | ORAL_TABLET | Freq: Three times a day (TID) | ORAL | Status: DC
  Administered 2018-09-16 – 2018-09-17 (×4): 10 mg via ORAL
  Filled 2018-09-16 (×4): qty 1

## 2018-09-16 MED ORDER — THIAMINE HCL 100 MG/ML IJ SOLN
100.0000 mg | Freq: Every day | INTRAMUSCULAR | Status: DC
  Administered 2018-09-17: 10:00:00 100 mg via INTRAVENOUS
  Filled 2018-09-16: qty 2

## 2018-09-16 MED ORDER — FUROSEMIDE 10 MG/ML IJ SOLN
20.0000 mg | Freq: Once | INTRAMUSCULAR | Status: AC
  Administered 2018-09-16: 20 mg via INTRAVENOUS
  Filled 2018-09-16: qty 2

## 2018-09-16 MED ORDER — LORAZEPAM 1 MG PO TABS: 1.0000 mg | ORAL_TABLET | Freq: Four times a day (QID) | ORAL | Status: DC | PRN

## 2018-09-16 MED ORDER — IPRATROPIUM-ALBUTEROL 0.5-2.5 (3) MG/3ML IN SOLN
3.0000 mL | Freq: Three times a day (TID) | RESPIRATORY_TRACT | Status: DC
  Administered 2018-09-16 (×2): 3 mL via RESPIRATORY_TRACT
  Filled 2018-09-16 (×2): qty 3

## 2018-09-16 MED ORDER — HYDRALAZINE HCL 25 MG PO TABS
25.0000 mg | ORAL_TABLET | Freq: Two times a day (BID) | ORAL | Status: DC
  Administered 2018-09-16 – 2018-09-17 (×4): 25 mg via ORAL
  Filled 2018-09-16 (×5): qty 1

## 2018-09-16 MED ORDER — ONDANSETRON HCL 4 MG/2ML IJ SOLN: 4.0000 mg | Freq: Four times a day (QID) | INTRAMUSCULAR | Status: DC | PRN

## 2018-09-16 MED ORDER — ACETAMINOPHEN 650 MG RE SUPP: 650.0000 mg | Freq: Four times a day (QID) | RECTAL | Status: DC | PRN

## 2018-09-16 MED ORDER — ACETAMINOPHEN 325 MG PO TABS
650.0000 mg | ORAL_TABLET | Freq: Four times a day (QID) | ORAL | Status: DC | PRN
  Administered 2018-09-16: 650 mg via ORAL
  Filled 2018-09-16 (×2): qty 2

## 2018-09-16 MED ORDER — HYDROCHLOROTHIAZIDE 25 MG PO TABS
25.0000 mg | ORAL_TABLET | Freq: Two times a day (BID) | ORAL | Status: DC
  Administered 2018-09-16 – 2018-09-17 (×3): 25 mg via ORAL
  Filled 2018-09-16 (×3): qty 1

## 2018-09-16 MED ORDER — METHYLPREDNISOLONE SODIUM SUCC 40 MG IJ SOLR
40.0000 mg | Freq: Two times a day (BID) | INTRAMUSCULAR | Status: DC
  Administered 2018-09-16 – 2018-09-17 (×3): 40 mg via INTRAVENOUS
  Filled 2018-09-16 (×3): qty 1

## 2018-09-16 MED ORDER — IPRATROPIUM-ALBUTEROL 0.5-2.5 (3) MG/3ML IN SOLN: 3.0000 mL | Freq: Four times a day (QID) | RESPIRATORY_TRACT | Status: DC

## 2018-09-16 MED ORDER — OXYCODONE-ACETAMINOPHEN 10-325 MG PO TABS: 1.0000 | ORAL_TABLET | Freq: Three times a day (TID) | ORAL | Status: DC | PRN

## 2018-09-16 MED ORDER — FOLIC ACID 1 MG PO TABS
1.0000 mg | ORAL_TABLET | Freq: Every day | ORAL | Status: DC
  Administered 2018-09-17: 1 mg via ORAL
  Filled 2018-09-16 (×2): qty 1

## 2018-09-16 MED ORDER — ALPRAZOLAM 0.5 MG PO TABS
1.0000 mg | ORAL_TABLET | Freq: Three times a day (TID) | ORAL | Status: DC | PRN
  Administered 2018-09-16 – 2018-09-17 (×3): 1 mg via ORAL
  Filled 2018-09-16 (×4): qty 2

## 2018-09-16 MED ORDER — NEBIVOLOL HCL 10 MG PO TABS
5.0000 mg | ORAL_TABLET | Freq: Every day | ORAL | Status: DC
  Administered 2018-09-16 – 2018-09-17 (×2): 5 mg via ORAL
  Filled 2018-09-16 (×2): qty 1

## 2018-09-16 MED ORDER — SODIUM CHLORIDE 0.9 % IV BOLUS
1000.0000 mL | Freq: Once | INTRAVENOUS | Status: AC
  Administered 2018-09-16: 01:00:00 1000 mL via INTRAVENOUS

## 2018-09-16 NOTE — Care Management (Signed)
Patient gave permission to call his brother Vixen [phone]. Called Vixen from patient's room on speaker phone ( with Vixen's permission). Patient can stay with Vixen at discharge. Vixen address is [address] [place], [place]. Vixen aware discharge is tomorrow and can pick Henrene Pastor up after 3 pm. Vixen requesting bedside nurse to call him tomorrow.    Octavious Zidek is patient's ex wife [phone].  Magdalen Spatz RN [phone]
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
## 2018-09-16 NOTE — Plan of Care (Signed)
  Problem: Elimination: Goal: Will not experience complications related to urinary retention Outcome: Progressing   Problem: Pain Managment: Goal: General experience of comfort will improve Outcome: Progressing   Problem: Skin Integrity: Goal: Risk for impaired skin integrity will decrease Outcome: Progressing   
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
## 2018-09-16 NOTE — Progress Notes (Signed)
The patient has stated that he does not want to take his nebulizer treatments stating that he "can't just take anything." he did complete most of his scheduled treatment for this afternoon.

## 2018-09-16 NOTE — Progress Notes (Signed)
SATURATION QUALIFICATIONS: (This note is used to comply with regulatory documentation for home oxygen)  Patient Saturations on Room Air at Rest = 89%  Patient Saturations on Room Air while Ambulating = 92%  Patient Saturations on N/A Liters of oxygen while Ambulating = N/A  Mabeline Caras, PT, DPT Acute Rehabilitation Services  Pager [phone] Office [phone]
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
## 2018-09-17 LAB — CBC
HCT: 47.5 % (ref 39.0–52.0)
Hemoglobin: 16.2 g/dL (ref 13.0–17.0)
MCH: 31.2 pg (ref 26.0–34.0)
MCHC: 34.1 g/dL (ref 30.0–36.0)
MCV: 91.5 fL (ref 80.0–100.0)
Platelets: 147 10*3/uL — ABNORMAL LOW (ref 150–400)
RBC: 5.19 MIL/uL (ref 4.22–5.81)
RDW: 14.7 % (ref 11.5–15.5)
WBC: 9.1 10*3/uL (ref 4.0–10.5)
nRBC: 0 % (ref 0.0–0.2)

## 2018-09-17 LAB — BASIC METABOLIC PANEL
Anion gap: 11 (ref 5–15)
BUN: 21 mg/dL (ref 8–23)
CO2: 28 mmol/L (ref 22–32)
Calcium: 8.8 mg/dL — ABNORMAL LOW (ref 8.9–10.3)
Chloride: 96 mmol/L — ABNORMAL LOW (ref 98–111)
Creatinine, Ser: 1.13 mg/dL (ref 0.61–1.24)
GFR calc Af Amer: >60 mL/min (ref >60)
GFR calc non Af Amer: >60 mL/min (ref >60)
Glucose, Bld: 135 mg/dL — ABNORMAL HIGH (ref 70–99)
Potassium: 4.1 mmol/L (ref 3.5–5.1)
Sodium: 135 mmol/L (ref 135–145)

## 2018-09-17 MED ORDER — PREDNISONE 20 MG PO TABS: 40.0000 mg | ORAL_TABLET | Freq: Every day | ORAL | Status: DC

## 2018-09-17 MED ORDER — PREDNISONE 20 MG PO TABS: 40.0000 mg | ORAL_TABLET | Freq: Every day | ORAL | 0 refills | Status: AC

## 2018-09-17 MED ORDER — HYDRALAZINE HCL 25 MG PO TABS: 25.0000 mg | ORAL_TABLET | Freq: Once | ORAL | Status: DC

## 2018-09-17 MED ORDER — CLONIDINE HCL 0.1 MG PO TABS: 0.1000 mg | ORAL_TABLET | Freq: Two times a day (BID) | ORAL | 1 refills | Status: AC

## 2018-09-17 MED ORDER — IPRATROPIUM-ALBUTEROL 0.5-2.5 (3) MG/3ML IN SOLN: 3.0000 mL | RESPIRATORY_TRACT | 0 refills | Status: DC | PRN
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
## 2020-07-22 ENCOUNTER — Encounter (HOSPITAL_COMMUNITY): Payer: Self-pay | Admitting: *Deleted

## 2020-07-22 ENCOUNTER — Emergency Department (HOSPITAL_COMMUNITY)
Admission: EM | Admit: 2020-07-22 | Discharge: 2020-07-22 | Disposition: A | Discharge status: Home / Self Care | Payer: Medicare Other | Attending: Emergency Medicine | Admitting: Emergency Medicine

## 2020-07-22 ENCOUNTER — Other Ambulatory Visit: Payer: Self-pay

## 2020-07-22 DIAGNOSIS — Z79899 Other long term (current) drug therapy: Secondary | ICD-10-CM | POA: Insufficient documentation

## 2020-07-22 DIAGNOSIS — Z96642 Presence of left artificial hip joint: Secondary | ICD-10-CM | POA: Insufficient documentation

## 2020-07-22 DIAGNOSIS — I10 Essential (primary) hypertension: Secondary | ICD-10-CM | POA: Diagnosis not present

## 2020-07-22 DIAGNOSIS — Z760 Encounter for issue of repeat prescription: Secondary | ICD-10-CM | POA: Diagnosis not present

## 2020-07-22 DIAGNOSIS — F17210 Nicotine dependence, cigarettes, uncomplicated: Secondary | ICD-10-CM | POA: Insufficient documentation

## 2020-07-22 DIAGNOSIS — J449 Chronic obstructive pulmonary disease, unspecified: Secondary | ICD-10-CM | POA: Insufficient documentation
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
## 2020-07-22 NOTE — ED Triage Notes (Signed)
Pt is here to have methadone and oxycodone refilled.  Pt states he cannot function without them. Pt is awaiting a hip surgery r/t arthritis.

## 2020-07-22 NOTE — Discharge Instructions (Addendum)
Return for any problem.   It is not possible for the ED to refill prescriptions for methadone and Percocet used on a chronic basis.
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
## 2021-07-22 DIAGNOSIS — G894 Chronic pain syndrome: Secondary | ICD-10-CM | POA: Diagnosis not present

## 2021-07-22 DIAGNOSIS — R69 Illness, unspecified: Secondary | ICD-10-CM | POA: Diagnosis not present

## 2021-07-22 DIAGNOSIS — G8921 Chronic pain due to trauma: Secondary | ICD-10-CM | POA: Diagnosis not present

## 2021-07-22 DIAGNOSIS — M545 Low back pain, unspecified: Secondary | ICD-10-CM | POA: Diagnosis not present

## 2021-08-20 DIAGNOSIS — R69 Illness, unspecified: Secondary | ICD-10-CM | POA: Diagnosis not present

## 2021-08-20 DIAGNOSIS — K5900 Constipation, unspecified: Secondary | ICD-10-CM | POA: Diagnosis not present

## 2021-08-20 DIAGNOSIS — H6120 Impacted cerumen, unspecified ear: Secondary | ICD-10-CM | POA: Diagnosis not present

## 2021-08-20 DIAGNOSIS — G8921 Chronic pain due to trauma: Secondary | ICD-10-CM | POA: Diagnosis not present

## 2021-08-20 DIAGNOSIS — G894 Chronic pain syndrome: Secondary | ICD-10-CM | POA: Diagnosis not present

## 2021-09-17 DIAGNOSIS — M545 Low back pain, unspecified: Secondary | ICD-10-CM | POA: Diagnosis not present

## 2021-09-17 DIAGNOSIS — R69 Illness, unspecified: Secondary | ICD-10-CM | POA: Diagnosis not present

## 2021-09-17 DIAGNOSIS — G894 Chronic pain syndrome: Secondary | ICD-10-CM | POA: Diagnosis not present

## 2021-09-17 DIAGNOSIS — G8921 Chronic pain due to trauma: Secondary | ICD-10-CM | POA: Diagnosis not present

## 2021-10-21 DIAGNOSIS — G8921 Chronic pain due to trauma: Secondary | ICD-10-CM | POA: Diagnosis not present

## 2021-10-21 DIAGNOSIS — R69 Illness, unspecified: Secondary | ICD-10-CM | POA: Diagnosis not present

## 2021-10-21 DIAGNOSIS — G894 Chronic pain syndrome: Secondary | ICD-10-CM | POA: Diagnosis not present

## 2021-10-21 DIAGNOSIS — M545 Low back pain, unspecified: Secondary | ICD-10-CM | POA: Diagnosis not present

## 2022-09-07 DEATH — deceased
